# Patient Record
Sex: Female | Born: 1965
Health system: Southern US, Community
[De-identification: ages and names within clinical notes are randomized; demographics above are authoritative.]

## PROBLEM LIST (undated history)

## (undated) DIAGNOSIS — K5090 Crohn's disease, unspecified, without complications: Secondary | ICD-10-CM

## (undated) DIAGNOSIS — I10 Essential (primary) hypertension: Secondary | ICD-10-CM

## (undated) HISTORY — PX: BREAST EXCISIONAL BIOPSY: SUR124

## (undated) HISTORY — DX: Crohn's disease, unspecified, without complications: K50.90

## (undated) HISTORY — DX: Essential (primary) hypertension: I10

## (undated) HISTORY — PX: BREAST SURGERY: SHX581

---

## 1999-10-07 ENCOUNTER — Other Ambulatory Visit: Admission: RE | Admit: 1999-10-07 | Discharge: 1999-10-07 | Payer: Self-pay | Admitting: Family Medicine

## 2000-10-24 ENCOUNTER — Inpatient Hospital Stay (HOSPITAL_COMMUNITY): Admission: AD | Admit: 2000-10-24 | Discharge: 2000-10-24 | Payer: Self-pay | Admitting: Obstetrics & Gynecology

## 2002-04-17 ENCOUNTER — Other Ambulatory Visit: Admission: RE | Admit: 2002-04-17 | Discharge: 2002-04-17 | Payer: Self-pay | Admitting: Obstetrics and Gynecology

## 2004-04-03 ENCOUNTER — Other Ambulatory Visit: Admission: RE | Admit: 2004-04-03 | Discharge: 2004-04-03 | Payer: Self-pay | Admitting: Obstetrics and Gynecology

## 2005-02-19 ENCOUNTER — Ambulatory Visit (HOSPITAL_COMMUNITY): Admission: RE | Admit: 2005-02-19 | Discharge: 2005-02-19 | Payer: Self-pay | Admitting: Obstetrics and Gynecology

## 2005-03-19 ENCOUNTER — Other Ambulatory Visit: Admission: RE | Admit: 2005-03-19 | Discharge: 2005-03-19 | Payer: Self-pay | Admitting: Obstetrics and Gynecology

## 2005-05-19 ENCOUNTER — Ambulatory Visit (HOSPITAL_COMMUNITY): Admission: RE | Admit: 2005-05-19 | Discharge: 2005-05-19 | Payer: Self-pay | Admitting: Obstetrics and Gynecology

## 2005-05-25 ENCOUNTER — Ambulatory Visit (HOSPITAL_COMMUNITY): Admission: RE | Admit: 2005-05-25 | Discharge: 2005-05-25 | Payer: Self-pay | Admitting: Obstetrics and Gynecology

## 2005-06-26 ENCOUNTER — Inpatient Hospital Stay (HOSPITAL_COMMUNITY): Admission: AD | Admit: 2005-06-26 | Discharge: 2005-06-26 | Payer: Self-pay | Admitting: Obstetrics and Gynecology

## 2005-06-27 ENCOUNTER — Ambulatory Visit (HOSPITAL_COMMUNITY): Admission: RE | Admit: 2005-06-27 | Discharge: 2005-06-27 | Payer: Self-pay | Admitting: Obstetrics and Gynecology

## 2005-07-12 ENCOUNTER — Inpatient Hospital Stay (HOSPITAL_COMMUNITY): Admission: AD | Admit: 2005-07-12 | Discharge: 2005-07-15 | Payer: Self-pay | Admitting: Obstetrics and Gynecology

## 2006-03-22 ENCOUNTER — Other Ambulatory Visit: Admission: RE | Admit: 2006-03-22 | Discharge: 2006-03-22 | Payer: Self-pay | Admitting: Obstetrics and Gynecology

## 2006-05-18 ENCOUNTER — Other Ambulatory Visit: Admission: RE | Admit: 2006-05-18 | Discharge: 2006-05-18 | Payer: Self-pay | Admitting: Obstetrics and Gynecology

## 2009-05-01 ENCOUNTER — Other Ambulatory Visit: Admission: RE | Admit: 2009-05-01 | Discharge: 2009-05-01 | Payer: Self-pay | Admitting: Family Medicine
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
## 2012-03-23 ENCOUNTER — Other Ambulatory Visit (HOSPITAL_COMMUNITY)
Admission: RE | Admit: 2012-03-23 | Discharge: 2012-03-23 | Disposition: A | Discharge status: Home / Self Care | Payer: BC Managed Care – PPO | Source: Ambulatory Visit | Attending: Obstetrics and Gynecology | Admitting: Obstetrics and Gynecology

## 2012-03-23 ENCOUNTER — Other Ambulatory Visit: Payer: Self-pay | Admitting: Obstetrics and Gynecology

## 2012-03-23 DIAGNOSIS — Z1159 Encounter for screening for other viral diseases: Secondary | ICD-10-CM | POA: Insufficient documentation

## 2012-03-23 DIAGNOSIS — Z01419 Encounter for gynecological examination (general) (routine) without abnormal findings: Secondary | ICD-10-CM | POA: Insufficient documentation

## 2012-03-30 ENCOUNTER — Other Ambulatory Visit: Payer: Self-pay | Admitting: Obstetrics and Gynecology

## 2012-03-30 DIAGNOSIS — Z1231 Encounter for screening mammogram for malignant neoplasm of breast: Secondary | ICD-10-CM

## 2012-04-01 ENCOUNTER — Other Ambulatory Visit: Payer: Self-pay | Admitting: Obstetrics and Gynecology

## 2012-04-01 DIAGNOSIS — Z1231 Encounter for screening mammogram for malignant neoplasm of breast: Secondary | ICD-10-CM

## 2012-05-19 ENCOUNTER — Ambulatory Visit
Admission: RE | Admit: 2012-05-19 | Discharge: 2012-05-19 | Disposition: A | Discharge status: Home / Self Care | Payer: BC Managed Care – PPO | Source: Ambulatory Visit | Attending: Obstetrics and Gynecology | Admitting: Obstetrics and Gynecology

## 2012-05-19 DIAGNOSIS — Z1231 Encounter for screening mammogram for malignant neoplasm of breast: Secondary | ICD-10-CM

## 2016-06-15 ENCOUNTER — Other Ambulatory Visit (HOSPITAL_COMMUNITY)
Admission: RE | Admit: 2016-06-15 | Discharge: 2016-06-15 | Disposition: A | Discharge status: Home / Self Care | Payer: BC Managed Care – PPO | Source: Ambulatory Visit | Attending: Physician Assistant | Admitting: Physician Assistant

## 2016-06-15 ENCOUNTER — Other Ambulatory Visit: Payer: Self-pay | Admitting: Physician Assistant

## 2016-06-15 DIAGNOSIS — Z01419 Encounter for gynecological examination (general) (routine) without abnormal findings: Secondary | ICD-10-CM | POA: Diagnosis present

## 2016-06-15 DIAGNOSIS — Z1151 Encounter for screening for human papillomavirus (HPV): Secondary | ICD-10-CM | POA: Insufficient documentation

## 2016-06-17 LAB — CYTOLOGY - PAP

## 2016-10-09 ENCOUNTER — Other Ambulatory Visit: Payer: Self-pay | Admitting: Family Medicine

## 2016-10-09 DIAGNOSIS — Z1231 Encounter for screening mammogram for malignant neoplasm of breast: Secondary | ICD-10-CM

## 2016-11-02 ENCOUNTER — Ambulatory Visit: Payer: BC Managed Care – PPO

## 2016-11-05 ENCOUNTER — Inpatient Hospital Stay: Admission: RE | Admit: 2016-11-05 | Payer: BC Managed Care – PPO | Source: Ambulatory Visit

## 2016-11-12 ENCOUNTER — Ambulatory Visit
Admission: RE | Admit: 2016-11-12 | Discharge: 2016-11-12 | Disposition: A | Discharge status: Home / Self Care | Payer: BC Managed Care – PPO | Source: Ambulatory Visit | Attending: Family Medicine | Admitting: Family Medicine

## 2016-11-12 DIAGNOSIS — Z1231 Encounter for screening mammogram for malignant neoplasm of breast: Secondary | ICD-10-CM

## 2016-11-19 ENCOUNTER — Ambulatory Visit: Payer: BC Managed Care – PPO

## 2017-01-28 ENCOUNTER — Other Ambulatory Visit: Payer: Self-pay | Admitting: Obstetrics & Gynecology

## 2020-04-05 ENCOUNTER — Other Ambulatory Visit: Payer: Self-pay | Admitting: Family Medicine

## 2020-04-05 DIAGNOSIS — Z1231 Encounter for screening mammogram for malignant neoplasm of breast: Secondary | ICD-10-CM

## 2020-04-12 ENCOUNTER — Ambulatory Visit: Payer: BC Managed Care – PPO

## 2020-04-17 ENCOUNTER — Ambulatory Visit: Payer: BC Managed Care – PPO

## 2020-04-19 ENCOUNTER — Ambulatory Visit
Admission: RE | Admit: 2020-04-19 | Discharge: 2020-04-19 | Disposition: A | Discharge status: Home / Self Care | Payer: BC Managed Care – PPO | Source: Ambulatory Visit | Attending: Family Medicine | Admitting: Family Medicine

## 2020-04-19 ENCOUNTER — Other Ambulatory Visit: Payer: Self-pay

## 2020-04-19 ENCOUNTER — Ambulatory Visit: Payer: BC Managed Care – PPO

## 2020-04-19 DIAGNOSIS — Z1231 Encounter for screening mammogram for malignant neoplasm of breast: Secondary | ICD-10-CM

## 2020-04-25 ENCOUNTER — Ambulatory Visit: Payer: BC Managed Care – PPO

## 2020-05-08 ENCOUNTER — Telehealth: Payer: Self-pay | Admitting: Family

## 2020-05-08 NOTE — Telephone Encounter (Signed)
Advised patient of New Patient appointments that have been scheduled. Letter/Map & calendar was mailed as well

## 2020-05-23 ENCOUNTER — Other Ambulatory Visit: Payer: Self-pay | Admitting: Family

## 2020-05-23 DIAGNOSIS — D75839 Thrombocytosis, unspecified: Secondary | ICD-10-CM

## 2020-05-23 DIAGNOSIS — D508 Other iron deficiency anemias: Secondary | ICD-10-CM

## 2020-05-24 ENCOUNTER — Other Ambulatory Visit: Payer: Self-pay

## 2020-05-24 ENCOUNTER — Encounter: Payer: Self-pay | Admitting: Family

## 2020-05-24 ENCOUNTER — Inpatient Hospital Stay: Payer: BC Managed Care – PPO | Attending: Hematology & Oncology

## 2020-05-24 ENCOUNTER — Inpatient Hospital Stay (HOSPITAL_BASED_OUTPATIENT_CLINIC_OR_DEPARTMENT_OTHER): Payer: BC Managed Care – PPO | Admitting: Family

## 2020-05-24 ENCOUNTER — Telehealth: Payer: Self-pay | Admitting: *Deleted

## 2020-05-24 VITALS — BP 149/77 | HR 72 | Temp 98.4°F | Resp 18 | Ht 69.0 in | Wt 196.8 lb

## 2020-05-24 DIAGNOSIS — R42 Dizziness and giddiness: Secondary | ICD-10-CM | POA: Insufficient documentation

## 2020-05-24 DIAGNOSIS — R002 Palpitations: Secondary | ICD-10-CM

## 2020-05-24 DIAGNOSIS — D75839 Thrombocytosis, unspecified: Secondary | ICD-10-CM

## 2020-05-24 DIAGNOSIS — R5383 Other fatigue: Secondary | ICD-10-CM | POA: Diagnosis not present

## 2020-05-24 DIAGNOSIS — D508 Other iron deficiency anemias: Secondary | ICD-10-CM

## 2020-05-24 DIAGNOSIS — D6800 Von Willebrand disease, unspecified: Secondary | ICD-10-CM

## 2020-05-24 DIAGNOSIS — D473 Essential (hemorrhagic) thrombocythemia: Secondary | ICD-10-CM | POA: Insufficient documentation

## 2020-05-24 LAB — CBC WITH DIFFERENTIAL (CANCER CENTER ONLY)
Abs Immature Granulocytes: 0.07 10*3/uL (ref 0.00–0.07)
Basophils Absolute: 0.1 10*3/uL (ref 0.0–0.1)
Basophils Relative: 1 %
Eosinophils Absolute: 0.4 10*3/uL (ref 0.0–0.5)
Eosinophils Relative: 3 %
HCT: 35.7 % — ABNORMAL LOW (ref 36.0–46.0)
Hemoglobin: 12 g/dL (ref 12.0–15.0)
Immature Granulocytes: 1 %
Lymphocytes Relative: 15 %
Lymphs Abs: 2.4 10*3/uL (ref 0.7–4.0)
MCH: 29.7 pg (ref 26.0–34.0)
MCHC: 33.6 g/dL (ref 30.0–36.0)
MCV: 88.4 fL (ref 80.0–100.0)
Monocytes Absolute: 1.1 10*3/uL — ABNORMAL HIGH (ref 0.1–1.0)
Monocytes Relative: 7 %
Neutro Abs: 11.4 10*3/uL — ABNORMAL HIGH (ref 1.7–7.7)
Neutrophils Relative %: 73 %
Platelet Count: 986 10*3/uL (ref 150–400)
RBC: 4.04 MIL/uL (ref 3.87–5.11)
RDW: 15.7 % — ABNORMAL HIGH (ref 11.5–15.5)
WBC Count: 15.5 10*3/uL — ABNORMAL HIGH (ref 4.0–10.5)
nRBC: 0 % (ref 0.0–0.2)

## 2020-05-24 LAB — CMP (CANCER CENTER ONLY)
ALT: 16 U/L (ref 0–44)
AST: 18 U/L (ref 15–41)
Albumin: 4.4 g/dL (ref 3.5–5.0)
Alkaline Phosphatase: 49 U/L (ref 38–126)
Anion gap: 6 (ref 5–15)
BUN: 10 mg/dL (ref 6–20)
CO2: 32 mmol/L (ref 22–32)
Calcium: 10.2 mg/dL (ref 8.9–10.3)
Chloride: 100 mmol/L (ref 98–111)
Creatinine: 0.76 mg/dL (ref 0.44–1.00)
GFR, Est AFR Am: >60 mL/min (ref >60)
GFR, Estimated: >60 mL/min (ref >60)
Glucose, Bld: 85 mg/dL (ref 70–99)
Potassium: 3.2 mmol/L — ABNORMAL LOW (ref 3.5–5.1)
Sodium: 138 mmol/L (ref 135–145)
Total Bilirubin: 0.4 mg/dL (ref 0.3–1.2)
Total Protein: 7.4 g/dL (ref 6.5–8.1)

## 2020-05-24 LAB — SAVE SMEAR(SSMR), FOR PROVIDER SLIDE REVIEW

## 2020-05-24 NOTE — Progress Notes (Signed)
Hematology/Oncology Consultation   Name: Amy Johnson      MRN: [id number]    Location: Room/bed info not found  Date: 05/24/2020 Time:3:40 PM   REFERRING PHYSICIAN: Harlan Stains, MD  REASON FOR CONSULT: Thrombocytosis   DIAGNOSIS:  Essential thrombocythemia Acquired Von Willebrand  HISTORY OF PRESENT ILLNESS: Amy Johnson is a very pleasant 54 yo African American female with recent diagnosis of thrombocythemia.  She states that she missed last years physical due to Covid but has no prior knowledge of having an elevated platelet count.  Platelets are 986 today and WBC count 15.5. Hgb stable at 12.0, MCV 88.  She is symptomatic with fatigue, lightheadedness and occasional palpitations.  She has had some iron deficiency and takes a slow release oral iron supplement by mouth daily. She states that her recent cycles have been irregular and heavy with cramping and clots. She has history of uterine fibroids and is followed by gynecology.  She has noted occasional hot flashes.  She has had no other episodes of blood loss. No abnormal bruising, no petechiae.  She is scheduled to have her first colonoscopy on 06/28/2020.  She denies sickle cell disease or trait.  She has one son and no history of miscarriage.  Her father had lung cancer, maternal grandfather - esophageal and maternal uncle - leukemia.  She denies fever, chills, n/v, cough, rash, SOB, chest pain, abdominal pain or changes in bowel or bladder habits.  She takes Miralax as needed for constipation.  She has occasional numbness and tingling in her heft leg and pain and tingling in her heft foot. She states that this started after wearing pointy toed shoes.  No swelling in her extremities at this time. She notes occasional puffiness in her ankles when she sits for an extended period of time.  No falls or syncopal episodes to report.  She has maintained a good appetite but admits that she could hydrate better throughout the day.  No  smoking, ETOH or recreational drug use.  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   ROS: All other 10 point review of systems is negative.   PAST MEDICAL HISTORY:   No past medical history on file.  ALLERGIES: No Known Allergies    MEDICATIONS:  Current Outpatient Medications on File Prior to Visit  Medication Sig Dispense Refill  . EDARBYCLOR 40-25 MG TABS Take 1 tablet by mouth daily.     No current facility-administered medications on file prior to visit.     PAST SURGICAL HISTORY Past Surgical History:  Procedure Laterality Date  . BREAST EXCISIONAL BIOPSY Left     FAMILY HISTORY: No family history on file.  SOCIAL HISTORY:  has no history on file for tobacco use, alcohol use, and drug use.  PERFORMANCE STATUS: The patient's performance status is 1 - Symptomatic but completely ambulatory  PHYSICAL EXAM: Most Recent Vital Signs: There were no vitals taken for this visit. BP (!) 149/77 (BP Location: Right Arm, Patient Position: Sitting)   Pulse 72   Temp 98.4 F (36.9 C) (Oral)   Resp 18   Ht '5\' 9"'  (1.753 m)   Wt 196 lb 12.8 oz (89.3 kg)   SpO2 100%   BMI 29.06 kg/m   General Appearance:    Alert, cooperative, no distress, appears stated age  Head:    Normocephalic, without obvious abnormality, atraumatic  Eyes:    PERRL, conjunctiva/corneas clear, EOM's intact,  fundi    benign, both eyes        Throat:   Lips, mucosa, and tongue normal; teeth and gums normal  Neck:   Supple, symmetrical, trachea midline, no adenopathy;    thyroid:  no enlargement/tenderness/nodules; no carotid   bruit or JVD  Back:     Symmetric, no curvature, ROM normal, no CVA tenderness  Lungs:     Clear to auscultation bilaterally, respirations unlabored  Chest Wall:    No tenderness or deformity   Heart:    Regular rate and rhythm, S1 and S2 normal, no murmur, rub   or gallop     Abdomen:     Soft, non-tender,  bowel sounds active all four quadrants,    no masses, no organomegaly        Extremities:   Extremities normal, atraumatic, no cyanosis or edema  Pulses:   2+ and symmetric all extremities  Skin:   Skin color, texture, turgor normal, no rashes or lesions  Lymph nodes:   Cervical, supraclavicular, and axillary nodes normal  Neurologic:   CNII-XII intact, normal strength, sensation and reflexes    throughout    LABORATORY DATA:  Results for orders placed or performed in visit on 05/24/20 (from the past 48 hour(s))  CBC with Differential (Cancer Center Only)     Status: Abnormal   Collection Time: 05/24/20  2:48 PM  Result Value Ref Range   WBC Count 15.5 (H) 4.0 - 10.5 K/uL   RBC 4.04 3.87 - 5.11 MIL/uL   Hemoglobin 12.0 12.0 - 15.0 g/dL   HCT 35.7 (L) 36 - 46 %   MCV 88.4 80.0 - 100.0 fL   MCH 29.7 26.0 - 34.0 pg   MCHC 33.6 30.0 - 36.0 g/dL   RDW 15.7 (H) 11.5 - 15.5 %   Platelet Count 986 (HH) 150 - 400 K/uL    Comment: This critical result has verified and been called to Amy Johnson by Amy Johnson on 08 06 2021 at 1508, and has been read back. RTV   nRBC 0.0 0.0 - 0.2 %   Neutrophils Relative % 73 %   Neutro Abs 11.4 (H) 1.7 - 7.7 K/uL   Lymphocytes Relative 15 %   Lymphs Abs 2.4 0.7 - 4.0 K/uL   Monocytes Relative 7 %   Monocytes Absolute 1.1 (H) 0 - 1 K/uL   Eosinophils Relative 3 %   Eosinophils Absolute 0.4 0 - 0 K/uL   Basophils Relative 1 %   Basophils Absolute 0.1 0 - 0 K/uL   Immature Granulocytes 1 %   Abs Immature Granulocytes 0.07 0.00 - 0.07 K/uL    Comment: Performed at [facility] at [facility], [address] [place], [place] [address]  CMP ([facility] only)     Status: Abnormal   Collection Time: 05/24/20  2:48 PM  Result Value Ref Range   Sodium 138 135 - 145 mmol/L   Potassium 3.2 (L) 3.5 - 5.1 mmol/L   Chloride 100 98 - 111 mmol/L   CO2 32 22 - 32 mmol/L   Glucose, Bld 85 70 - 99 mg/dL    Comment: Glucose  reference range applies only to samples taken after fasting for at least 8 hours.   BUN 10 6 - 20 mg/dL   Creatinine 0.76 0.44 - 1.00 mg/dL   Calcium 10.2 8.9 - 10.3 mg/dL   Total Protein 7.4 6.5 - 8.1 g/dL   Albumin  4.4 3.5 - 5.0 g/dL   AST 18 15 - 41 U/L   ALT 16 0 - 44 U/L   Alkaline Phosphatase 49 38 - 126 U/L   Total Bilirubin 0.4 0.3 - 1.2 mg/dL   GFR, Est Non Af Am >60 >60 mL/min   GFR, Est AFR Am >60 >60 mL/min   Anion gap 6 5 - 15    Comment: Performed at [facility] at [facility], [address], [place], [place] [address]  [facility])     Status: None   Collection Time: 05/24/20  2:48 PM  Result Value Ref Range   Smear Review SMEAR STAINED AND AVAILABLE FOR REVIEW     Comment: Performed at [facility] at [facility], [address] [place], [place] [address]      RADIOGRAPHY: No results found.     PATHOLOGY: None  ASSESSMENT/PLAN: Ms. Splinter is a very pleasant 54 yo African American female with recent diagnosis or an elevated platelet count as well as an elevated WBC count.  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  She also has acquired VWD so she will need to avoid aspirin and aspirin and NSAIDS. She will try taking tylenol instead of ibuprofen for menstrual cramps. She will also notify us prior to having any surgical procedures.  We will get her onto Hydrea 500 mg PO BID and plan to see her back in another month for follow-up and repeat lab work. We discussed potential side effects in detail. I also reached out to [facility] to let her know we would be sending in a prescription.   All questions at this time were answered and she is in agreement with the plan. She can contact our office with any questions or concerns. We can certainly see her sooner if needed.   She was discussed with and also seen by Dr. Marin Olp and he is in agreement with  the aforementioned.   Laverna Peace, NP    ADDENDUM: I saw Ms. Kem with Jud[facility] Roch.  Unfortunately, I have a feeling that we are probably looking at an actual myeloproliferative disorder.  I looked at her blood smear under the microscope.  She had no nucleated red blood cells.  There were really no hypochromic or microcytic red blood cells.  She had a lot of platelets.  She had several large platelets.  There were a couple hypersegmented polys.  With the JAK2 panel I think is going to be important.  Unfortunately, I do not think we have iron studies on her even though I think that her iron should be okay given the fact that she is not anemic and her MCV is okay.  She still has her spleen in it.  She does not have sickle cell so there is no splenic "auto infarction."  I would hold off on x-rays right now.  If we do find she does have a myeloproliferative disorder, then we would probably get an ultrasound of the abdomen looking for splenomegaly.  We are sending off the von Willebrand panel.  If she does have essential thrombocythemia, she would be at high risk for an acquired von Willebrand disease.  As such, we told her not to take any aspirin right now.  Ultimately, she may need to have a bone marrow biopsy done if her tests are not conclusive.  She is very nice.  She is a  schoolteacher.  We really want to make sure that she is able to teach and not miss classes.  Spent about 45 minutes with her today.  I know that she was quite inundated with a lot of information.  She asked a lot of good questions.  She is certainly concerned over the possibility of a primary bone marrow disorder.  Once we get our labs back, particular the JAK2 panel, then we will get her back to the office.  Lattie Haw, MD

## 2020-05-24 NOTE — Telephone Encounter (Signed)
Platelets reported 986 by Richardson Landry in lab.  Dr Marin Olp notified  Patient seen in office today

## 2020-05-27 LAB — IRON AND TIBC
Iron: 55 ug/dL (ref 41–142)
Saturation Ratios: 18 % — ABNORMAL LOW (ref 21–57)
TIBC: 306 ug/dL (ref 236–444)
UIBC: 251 ug/dL (ref 120–384)

## 2020-05-27 LAB — LACTATE DEHYDROGENASE: LDH: 220 U/L — ABNORMAL HIGH (ref 98–192)

## 2020-05-27 LAB — FERRITIN: Ferritin: 26 ng/mL (ref 11–307)

## 2020-05-28 ENCOUNTER — Inpatient Hospital Stay: Payer: BC Managed Care – PPO

## 2020-05-28 ENCOUNTER — Other Ambulatory Visit: Payer: Self-pay

## 2020-05-28 DIAGNOSIS — D473 Essential (hemorrhagic) thrombocythemia: Secondary | ICD-10-CM

## 2020-05-28 DIAGNOSIS — D6800 Von Willebrand disease, unspecified: Secondary | ICD-10-CM

## 2020-05-28 LAB — APTT: aPTT: 39 seconds — ABNORMAL HIGH (ref 24–36)

## 2020-05-29 LAB — VON WILLEBRAND PANEL
Coagulation Factor VIII: 93 % (ref 56–140)
Ristocetin Co-factor, Plasma: 25 % — ABNORMAL LOW (ref 50–200)
Von Willebrand Antigen, Plasma: 74 % (ref 50–200)

## 2020-05-29 LAB — COAG STUDIES INTERP REPORT

## 2020-06-10 LAB — JAK 2 V617F (GENPATH)

## 2020-06-13 ENCOUNTER — Other Ambulatory Visit: Payer: Self-pay | Admitting: Hematology & Oncology

## 2020-06-13 MED ORDER — HYDROXYUREA 500 MG PO CAPS: 500.0000 mg | ORAL_CAPSULE | Freq: Two times a day (BID) | ORAL | 3 refills | Status: DC

## 2020-06-14 ENCOUNTER — Other Ambulatory Visit: Payer: Self-pay | Admitting: Family

## 2020-06-14 DIAGNOSIS — D473 Essential (hemorrhagic) thrombocythemia: Secondary | ICD-10-CM

## 2020-06-14 MED ORDER — HYDROXYUREA 500 MG PO CAPS: 500.0000 mg | ORAL_CAPSULE | Freq: Two times a day (BID) | ORAL | 3 refills | Status: DC

## 2020-06-21 ENCOUNTER — Telehealth: Payer: Self-pay | Admitting: *Deleted

## 2020-06-21 NOTE — Telephone Encounter (Signed)
Received a call from patient stating that she had not received a call from her specialty pharmacy regarding her Hydrea and she has already received it in the mail.  [facility] who stated that her Rx is on hold there and had not been shipped yet.  Called patient who said that Rx came from [facility].  Told patient to call [facility] and inform them that you have not received proper counseling regarding medication.  She agreed to do so.  She then expressed a lot of anxiety about her new diagnosis and had many questions that I could not answer.  I suggested an appt with Laverna Peace or Dr Marin Olp to answer all of these questions.  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  I told her I would give a message to Laverna Peace to call her regarding her questions when we return from holiday weekend. Patient appreciative of call.

## 2020-06-25 ENCOUNTER — Other Ambulatory Visit: Payer: Self-pay | Admitting: Family

## 2020-06-25 ENCOUNTER — Telehealth: Payer: Self-pay | Admitting: Family

## 2020-06-25 DIAGNOSIS — D473 Essential (hemorrhagic) thrombocythemia: Secondary | ICD-10-CM

## 2020-06-25 MED ORDER — HYDROXYUREA 500 MG PO CAPS: 500.0000 mg | ORAL_CAPSULE | Freq: Two times a day (BID) | ORAL | 3 refills | Status: DC

## 2020-06-25 NOTE — Telephone Encounter (Signed)
I returned call to patient.  I left a message advising her that is she had any questions about her specific drug she would ned to call [facility] to discuss or should could wait for her next visit on 9/17 w/ Sarah C to discuss futher. Per 9/7 secure chat request

## 2020-06-26 ENCOUNTER — Telehealth: Payer: Self-pay | Admitting: Family

## 2020-06-26 ENCOUNTER — Other Ambulatory Visit: Payer: Self-pay | Admitting: Family

## 2020-06-26 NOTE — Telephone Encounter (Signed)
I tried calling patient to advise that Appointments for 9/17 had been rescheduled out 3 weeks per  9/8 sch msg.her voicemail was full so I have mailed an updated calendar & letter to her as well.

## 2020-06-26 NOTE — Telephone Encounter (Signed)
I spoke again with Amy Johnson via phone about her medication. She is concerned about side effects. We discussed these in detail and I sent her a link to sign up for MyChart. We will send her some more education materials and she will also contact [facility] for education. If she has any other questions our pharmacist is happy to discuss with her.  She is also concerned about taking the Hydrea BID so she will start out at 1 tablet once a day. She plans to start the Hydrea after her colonoscopy. We will see her again in a month (we will move her 9/17 appointment out another 3 weeks).  She will also be having a colonoscopy later this week. GI will contact our office if clearance is needed. Dr. Marin Olp was able to review her VWB panel and no intervention is needed prior to her procedure.  No other questions at this time.

## 2020-07-05 ENCOUNTER — Other Ambulatory Visit: Payer: BC Managed Care – PPO

## 2020-07-05 ENCOUNTER — Ambulatory Visit: Payer: BC Managed Care – PPO | Admitting: Family

## 2020-07-25 ENCOUNTER — Inpatient Hospital Stay: Payer: BC Managed Care – PPO

## 2020-07-25 ENCOUNTER — Inpatient Hospital Stay: Payer: BC Managed Care – PPO | Admitting: Family

## 2020-07-26 ENCOUNTER — Telehealth: Payer: Self-pay | Admitting: *Deleted

## 2020-07-26 NOTE — Telephone Encounter (Signed)
Call received from patient requesting that Sarah or Dr. Marin Olp review her recent CT enterography and call her back to discuss findings.  Informed pt that CT would not be reviewed until next week and to expect phone call then.  Patient appreciative of assistance and has no further questions at this time.

## 2020-08-01 ENCOUNTER — Ambulatory Visit: Payer: BC Managed Care – PPO | Admitting: Family

## 2020-08-01 ENCOUNTER — Other Ambulatory Visit: Payer: BC Managed Care – PPO

## 2020-08-05 ENCOUNTER — Other Ambulatory Visit: Payer: Self-pay | Admitting: Family Medicine

## 2020-08-23 ENCOUNTER — Telehealth: Payer: Self-pay | Admitting: *Deleted

## 2020-08-23 ENCOUNTER — Other Ambulatory Visit: Payer: Self-pay

## 2020-08-23 ENCOUNTER — Inpatient Hospital Stay (HOSPITAL_BASED_OUTPATIENT_CLINIC_OR_DEPARTMENT_OTHER): Payer: BC Managed Care – PPO | Admitting: Family

## 2020-08-23 ENCOUNTER — Inpatient Hospital Stay: Payer: BC Managed Care – PPO | Attending: Hematology & Oncology

## 2020-08-23 ENCOUNTER — Encounter: Payer: Self-pay | Admitting: Family

## 2020-08-23 VITALS — BP 124/67 | HR 73 | Resp 19 | Wt 200.4 lb

## 2020-08-23 DIAGNOSIS — D473 Essential (hemorrhagic) thrombocythemia: Secondary | ICD-10-CM

## 2020-08-23 DIAGNOSIS — D500 Iron deficiency anemia secondary to blood loss (chronic): Secondary | ICD-10-CM

## 2020-08-23 DIAGNOSIS — D6800 Von Willebrand disease, unspecified: Secondary | ICD-10-CM

## 2020-08-23 DIAGNOSIS — D508 Other iron deficiency anemias: Secondary | ICD-10-CM

## 2020-08-23 DIAGNOSIS — D509 Iron deficiency anemia, unspecified: Secondary | ICD-10-CM | POA: Diagnosis not present

## 2020-08-23 LAB — CMP (CANCER CENTER ONLY)
ALT: 12 U/L (ref 0–44)
AST: 14 U/L — ABNORMAL LOW (ref 15–41)
Albumin: 4.4 g/dL (ref 3.5–5.0)
Alkaline Phosphatase: 50 U/L (ref 38–126)
Anion gap: 5 (ref 5–15)
BUN: 12 mg/dL (ref 6–20)
CO2: 34 mmol/L — ABNORMAL HIGH (ref 22–32)
Calcium: 10.6 mg/dL — ABNORMAL HIGH (ref 8.9–10.3)
Chloride: 100 mmol/L (ref 98–111)
Creatinine: 0.7 mg/dL (ref 0.44–1.00)
GFR, Estimated: >60 mL/min (ref >60)
Glucose, Bld: 93 mg/dL (ref 70–99)
Potassium: 3.3 mmol/L — ABNORMAL LOW (ref 3.5–5.1)
Sodium: 139 mmol/L (ref 135–145)
Total Bilirubin: 0.3 mg/dL (ref 0.3–1.2)
Total Protein: 7.7 g/dL (ref 6.5–8.1)

## 2020-08-23 LAB — CBC WITH DIFFERENTIAL (CANCER CENTER ONLY)
Abs Immature Granulocytes: 0.04 10*3/uL (ref 0.00–0.07)
Basophils Absolute: 0.1 10*3/uL (ref 0.0–0.1)
Basophils Relative: 1 %
Eosinophils Absolute: 0.4 10*3/uL (ref 0.0–0.5)
Eosinophils Relative: 3 %
HCT: 37.2 % (ref 36.0–46.0)
Hemoglobin: 12 g/dL (ref 12.0–15.0)
Immature Granulocytes: 0 %
Lymphocytes Relative: 20 %
Lymphs Abs: 2.4 10*3/uL (ref 0.7–4.0)
MCH: 29.9 pg (ref 26.0–34.0)
MCHC: 32.3 g/dL (ref 30.0–36.0)
MCV: 92.5 fL (ref 80.0–100.0)
Monocytes Absolute: 1 10*3/uL (ref 0.1–1.0)
Monocytes Relative: 8 %
Neutro Abs: 8.2 10*3/uL — ABNORMAL HIGH (ref 1.7–7.7)
Neutrophils Relative %: 68 %
Platelet Count: 902 10*3/uL (ref 150–400)
RBC: 4.02 MIL/uL (ref 3.87–5.11)
RDW: 14.6 % (ref 11.5–15.5)
WBC Count: 12 10*3/uL — ABNORMAL HIGH (ref 4.0–10.5)
nRBC: 0 % (ref 0.0–0.2)

## 2020-08-23 LAB — APTT: aPTT: 40 seconds — ABNORMAL HIGH (ref 24–36)

## 2020-08-23 LAB — LACTATE DEHYDROGENASE: LDH: 188 U/L (ref 98–192)

## 2020-08-23 NOTE — Telephone Encounter (Signed)
Jory Ee NP notified of platelet count-902.  No new orders received at this time.

## 2020-08-24 NOTE — Progress Notes (Signed)
Hematology and Oncology Follow Up Visit  Amy Johnson [id number] 1965/12/06 54 y.o. 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 She started taking Hydrea 500 mg PO daily at bedtime about 3 weeks ago.  She notes that the pill makes her fingers tingling despite wearing gloves as well as her mouth and throat. No oral sores or lesions. WBC count is 12.0, platelets 902 and Hgb 12.0. PTT 40.  She was recently diagnosed with Crohn's disease. She has an appointment next week to discuss treatment options and states they mentioned Remicade and Humira as potential options.  No obvious blood loss noted. No bruising or petechiae.  She prefers to take an oral supplement for the iron deficiency and asked what supplement would be beneficial to her bone marrow. Folic acid was suggested and she plans to give this a try.  No fever, chills, n/v, cough, rash, dizziness, SOB, chest pain, palpitations, abdominal pain or changes in bowel or bladder habits.  No swelling, numbness or tingling in her extremities at this time.  She states that she was also diagnosed with ankylosing spondylosis.  Appetite is good and she is doing her best to stay well hydrated. Her weight is stable.   ECOG Performance Status: 1 - Symptomatic but completely ambulatory  Medications:  Allergies as of 08/23/2020   No Known Allergies     Medication List       Accurate as of August 23, 2020 11:59 PM. If you have any questions, ask your nurse or doctor.        STOP taking these medications   ibuprofen 800 MG tablet Commonly known as: ADVIL Stopped by: Laverna Peace, NP     TAKE these medications   COVID-19 Specimen Collection Kit See admin instructions.   Edarbyclor 40-25 MG Tabs Generic  drug: Azilsartan-Chlorthalidone Take 1 tablet by mouth daily.   Ferrous Sulfate Dried 143 (45 Fe) MG Tbcr Take by mouth.   FLINTSTONES W/IRON PO Take 1 tablet by mouth daily.   hydroxyurea 500 MG capsule Commonly known as: HYDREA Take 1 capsule (500 mg total) by mouth 2 (two) times daily. May take with food to minimize GI side effects.   metoprolol tartrate 50 MG tablet Commonly known as: LOPRESSOR Take 50 mg by mouth 2 (two) times daily.   Potassium Chloride ER 20 MEQ Tbcr Take 1 tablet by mouth 2 (two) times daily.   potassium chloride SA 20 MEQ tablet Commonly known as: KLOR-CON Take by mouth.   SLOW RELEASE IRON PO Take 1 capsule by mouth daily.   Vitamin D 50 MCG (2000 UT) tablet Take 2,000 Units by mouth daily.       Allergies: No Known Allergies  Past Medical History, Surgical history, Social history, and Family History were reviewed and updated.  Review of Systems: All other 10 point review of systems is negative.   Physical Exam:  weight is 200 lb 6.4 oz (90.9 kg). Her blood pressure is 124/67 and her pulse is 73. Her respiration is 19.   Wt Readings from Last 3 Encounters:  08/23/20 200 lb 6.4 oz (90.9 kg)  05/24/20 196 lb 12.8 oz (89.3 kg)    Ocular: Sclerae unicteric, pupils equal, round and reactive to light Ear-nose-throat: Oropharynx clear, dentition fair Lymphatic:  No cervical or supraclavicular adenopathy Lungs no rales or rhonchi, good excursion bilaterally Heart regular rate and rhythm, no murmur appreciated Abd soft, nontender, positive bowel sounds MSK no focal spinal tenderness, no joint edema Neuro: non-focal, well-oriented, appropriate affect Breasts: Deferred   Lab Results  Component Value Date   WBC 12.0 (H) 08/23/2020   HGB 12.0 08/23/2020   HCT 37.2 08/23/2020   MCV 92.5 08/23/2020   PLT 902 (HH) 08/23/2020   Lab Results  Component Value Date   FERRITIN 26 05/24/2020   IRON 55 05/24/2020   TIBC 306 05/24/2020   UIBC  251 05/24/2020   IRONPCTSAT 18 (L) 05/24/2020   Lab Results  Component Value Date   RBC 4.02 08/23/2020   No results found for: KPAFRELGTCHN, LAMBDASER, KAPLAMBRATIO No results found for: IGGSERUM, IGA, IGMSERUM No results found for: Odetta Pink, SPEI   Chemistry      Component Value Date/Time   NA 139 08/23/2020 1506   K 3.3 (L) 08/23/2020 1506   CL 100 08/23/2020 1506   CO2 34 (H) 08/23/2020 1506   BUN 12 08/23/2020 1506   CREATININE 0.70 08/23/2020 1506      Component Value Date/Time   CALCIUM 10.6 (H) 08/23/2020 1506   ALKPHOS 50 08/23/2020 1506   AST 14 (L) 08/23/2020 1506   ALT 12 08/23/2020 1506   BILITOT 0.3 08/23/2020 1506       Impression and Plan: Amy Johnson is a pleasant 54 yo African American with essential thrombocythemia, JAK2 positive, acquired VWB disease and iron deficiency secondary to intermittent GI blood loss with Crohn's.   We discussed her medication, lab work and aforementioned diagnoses in great detail.  We will continue to follow along closely with her monitoring symptoms and blood work.  Follow-up in 6 weeks.  She can contact our office with any questions or concerns.  Greater than 50% of her 1 hour visit was spent counseling and coordinating care.    Laverna Peace, NP 11/6/20219:06 AM

## 2020-08-26 LAB — IRON AND TIBC
Iron: 46 ug/dL (ref 41–142)
Saturation Ratios: 14 % — ABNORMAL LOW (ref 21–57)
TIBC: 320 ug/dL (ref 236–444)
UIBC: 274 ug/dL (ref 120–384)

## 2020-08-26 LAB — FERRITIN: Ferritin: 17 ng/mL (ref 11–307)

## 2020-08-27 ENCOUNTER — Telehealth: Payer: Self-pay | Admitting: *Deleted

## 2020-08-27 NOTE — Telephone Encounter (Signed)
Called pt regarding labs, pt advised she has not been taking her oral iron daily and will begin taking it everyday  starting tomorrow.

## 2020-08-27 NOTE — Telephone Encounter (Signed)
-----   Message from Eliezer Bottom, NP sent at 08/26/2020  3:08 PM EST ----- Iron is still low. Is she willing to try IV iron or does she want to continue the oral supplement? ----- Message ----- From: Interface, Lab In [place] Sent: 08/23/2020   3:17 PM EST To: Eliezer Bottom, NP

## 2020-10-03 ENCOUNTER — Inpatient Hospital Stay: Payer: BC Managed Care – PPO | Attending: Hematology & Oncology

## 2020-10-03 ENCOUNTER — Other Ambulatory Visit: Payer: Self-pay

## 2020-10-03 ENCOUNTER — Encounter: Payer: Self-pay | Admitting: Hematology & Oncology

## 2020-10-03 ENCOUNTER — Inpatient Hospital Stay (HOSPITAL_BASED_OUTPATIENT_CLINIC_OR_DEPARTMENT_OTHER): Payer: BC Managed Care – PPO | Admitting: Hematology & Oncology

## 2020-10-03 VITALS — BP 152/83 | HR 73 | Temp 97.5°F | Resp 18 | Wt 194.0 lb

## 2020-10-03 DIAGNOSIS — D473 Essential (hemorrhagic) thrombocythemia: Secondary | ICD-10-CM | POA: Diagnosis not present

## 2020-10-03 DIAGNOSIS — D500 Iron deficiency anemia secondary to blood loss (chronic): Secondary | ICD-10-CM

## 2020-10-03 DIAGNOSIS — K5090 Crohn's disease, unspecified, without complications: Secondary | ICD-10-CM | POA: Insufficient documentation

## 2020-10-03 DIAGNOSIS — D6800 Von Willebrand disease, unspecified: Secondary | ICD-10-CM

## 2020-10-03 DIAGNOSIS — D509 Iron deficiency anemia, unspecified: Secondary | ICD-10-CM | POA: Diagnosis not present

## 2020-10-03 LAB — CMP (CANCER CENTER ONLY)
ALT: 16 U/L (ref 0–44)
AST: 15 U/L (ref 15–41)
Albumin: 4.1 g/dL (ref 3.5–5.0)
Alkaline Phosphatase: 59 U/L (ref 38–126)
Anion gap: 12 (ref 5–15)
BUN: 12 mg/dL (ref 6–20)
CO2: 31 mmol/L (ref 22–32)
Calcium: 9.9 mg/dL (ref 8.9–10.3)
Chloride: 97 mmol/L — ABNORMAL LOW (ref 98–111)
Creatinine: 0.76 mg/dL (ref 0.44–1.00)
GFR, Estimated: >60 mL/min (ref >60)
Glucose, Bld: 110 mg/dL — ABNORMAL HIGH (ref 70–99)
Potassium: 3.5 mmol/L (ref 3.5–5.1)
Sodium: 140 mmol/L (ref 135–145)
Total Bilirubin: 0.5 mg/dL (ref 0.3–1.2)
Total Protein: 8 g/dL (ref 6.5–8.1)

## 2020-10-03 LAB — CBC WITH DIFFERENTIAL (CANCER CENTER ONLY)
Abs Immature Granulocytes: 0.06 10*3/uL (ref 0.00–0.07)
Basophils Absolute: 0.1 10*3/uL (ref 0.0–0.1)
Basophils Relative: 1 %
Eosinophils Absolute: 0.3 10*3/uL (ref 0.0–0.5)
Eosinophils Relative: 2 %
HCT: 37.3 % (ref 36.0–46.0)
Hemoglobin: 12.1 g/dL (ref 12.0–15.0)
Immature Granulocytes: 1 %
Lymphocytes Relative: 19 %
Lymphs Abs: 2.3 10*3/uL (ref 0.7–4.0)
MCH: 30.7 pg (ref 26.0–34.0)
MCHC: 32.4 g/dL (ref 30.0–36.0)
MCV: 94.7 fL (ref 80.0–100.0)
Monocytes Absolute: 1.2 10*3/uL — ABNORMAL HIGH (ref 0.1–1.0)
Monocytes Relative: 9 %
Neutro Abs: 8.5 10*3/uL — ABNORMAL HIGH (ref 1.7–7.7)
Neutrophils Relative %: 68 %
Platelet Count: 744 10*3/uL — ABNORMAL HIGH (ref 150–400)
RBC: 3.94 MIL/uL (ref 3.87–5.11)
RDW: 16.6 % — ABNORMAL HIGH (ref 11.5–15.5)
WBC Count: 12.5 10*3/uL — ABNORMAL HIGH (ref 4.0–10.5)
nRBC: 0 % (ref 0.0–0.2)

## 2020-10-03 LAB — APTT: aPTT: 39 seconds — ABNORMAL HIGH (ref 24–36)

## 2020-10-03 NOTE — Progress Notes (Signed)
Hematology and Oncology Follow Up Visit  Amy Johnson [id number] 03/31/1966 54 y.o. 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 Pazos is here today for follow-up.  Overall, things seem to be doing okay although she is now dealing with Crohn's disease.  She sees [facility] in [place].  I will have to talk to the doctors there.  There are are afraid of putting her on Remicade because she is on Hydrea.  She is on a very low-dose of Hydrea that will not be immunosuppressive.  It is hard to say how bad the Crohn's disease is.  She is responding to the Hydrea.  She is on a 500 mg a day.  Her platelet count is gradually coming down which is nice to see.  I told her that because of the Crohn's disease and likely iron deficiency, her platelet count will be on the higher side.  She said that she has a sore throat.  This happened after he ate an apple recently.  She has a cough.  The cough is not that productive.  There is no hemoptysis.  She thinks that this might be getting a little bit better.  She is on a steroid for her Crohn's disease.  I think the steroid is probably budesonide.  She has had no diarrhea.  There is no leg swelling.  She has had no rashes.  She says that on occasion she has some burning with her skin.  She thinks it might be from the [facility].  Currently, I would have to say that her performance status is ECOG 1.   Medications:  Allergies as of 10/03/2020      Reactions   Amlodipine Swelling   Irbesartan-hydrochlorothiazide Other (See Comments)   Swelling    Valsartan-hydrochlorothiazide Other (See Comments)   Drowsy   Other Other (See Comments)   Olmesartan Medoxomil-hctz Other (See Comments)   Other reaction(s): Cough      Medication List        Accurate as of October 03, 2020  3:59 PM. If you have any questions, ask your nurse or doctor.        STOP taking these medications   potassium chloride SA 20 MEQ tablet Commonly known as: KLOR-CON Stopped by: [name], MD     TAKE these medications   acetaminophen 650 MG CR tablet Commonly known as: TYLENOL Take by mouth.   budesonide 3 MG 24 hr capsule Commonly known as: ENTOCORT EC Take 9 mg by mouth daily.   COVID-19 Specimen Collection Kit See admin instructions.   Edarbyclor 40-25 MG Tabs Generic drug: Azilsartan-Chlorthalidone Take 1 tablet by mouth daily.   Ferrous Sulfate Dried 143 (45 Fe) MG Tbcr Take by mouth.   FLINTSTONES W/IRON PO Take 1 tablet by mouth daily.   hydroxyurea 500 MG capsule Commonly known as: HYDREA Take 1 capsule (500 mg total) by mouth 2 (two) times daily. May take with food to minimize GI side effects.   metoprolol tartrate 50 MG tablet Commonly known as: LOPRESSOR Take 50 mg by mouth 2 (two) times daily.   Potassium Chloride ER 20 MEQ Tbcr Take 1 tablet by mouth 2 (two) times daily.   SLOW RELEASE IRON PO Take 1 capsule by mouth daily.   Vitamin D 50 MCG (  2000 UT) tablet Take 2,000 Units by mouth daily.       Allergies:  Allergies  Allergen Reactions  . Amlodipine Swelling  . Irbesartan-Hydrochlorothiazide Other (See Comments)    Swelling    . Valsartan-Hydrochlorothiazide Other (See Comments)    Drowsy   . Other Other (See Comments)  . Olmesartan Medoxomil-Hctz Other (See Comments)    Other reaction(s): Cough    Past Medical History, Surgical history, Social history, and Family History were reviewed and updated.  Review of Systems: Review of Systems  Constitutional: Negative.   HENT: Positive for sore throat.   Eyes: Negative.   Respiratory: Positive for cough.   Cardiovascular: Negative.   Gastrointestinal: Positive for abdominal pain.  Genitourinary: Negative.   Musculoskeletal: Negative.    Skin: Negative.   Neurological: Negative.   Endo/Heme/Allergies: Negative.   Psychiatric/Behavioral: Negative.      Physical Exam:  weight is 194 lb (88 kg). Her oral temperature is 97.5 F (36.4 C) (abnormal). Her blood pressure is 152/83 (abnormal) and her pulse is 73. Her respiration is 18 and oxygen saturation is 99%.   Wt Readings from Last 3 Encounters:  10/03/20 194 lb (88 kg)  08/23/20 200 lb 6.4 oz (90.9 kg)  05/24/20 196 lb 12.8 oz (89.3 kg)    Physical Exam Vitals reviewed.  HENT:     Head: Normocephalic and atraumatic.     Mouth/Throat:     Mouth: Oropharynx is clear and moist.  Eyes:     Extraocular Movements: EOM normal.     Pupils: Pupils are equal, round, and reactive to light.  Cardiovascular:     Rate and Rhythm: Normal rate and regular rhythm.     Heart sounds: Normal heart sounds.  Pulmonary:     Effort: Pulmonary effort is normal.     Breath sounds: Normal breath sounds.  Abdominal:     General: Bowel sounds are normal.     Palpations: Abdomen is soft.  Musculoskeletal:        General: No tenderness, deformity or edema. Normal range of motion.     Cervical back: Normal range of motion.  Lymphadenopathy:     Cervical: No cervical adenopathy.  Skin:    General: Skin is warm and dry.     Findings: No erythema or rash.  Neurological:     Mental Status: She is alert and oriented to person, place, and time.  Psychiatric:        Mood and Affect: Mood and affect normal.        Behavior: Behavior normal.        Thought Content: Thought content normal.        Judgment: Judgment normal.      Lab Results  Component Value Date   WBC 12.5 (H) 10/03/2020   HGB 12.1 10/03/2020   HCT 37.3 10/03/2020   MCV 94.7 10/03/2020   PLT 744 (H) 10/03/2020   Lab Results  Component Value Date   FERRITIN 17 08/23/2020   IRON 46 08/23/2020   TIBC 320 08/23/2020   UIBC 274 08/23/2020   IRONPCTSAT 14 (L) 08/23/2020   Lab Results  Component Value Date    RBC 3.94 10/03/2020   No results found for: KPAFRELGTCHN, LAMBDASER, KAPLAMBRATIO No results found for: IGGSERUM, IGA, IGMSERUM No results found for: Ronnald Ramp, A1GS, A2GS, BETS, BETA2SER, GAMS, MSPIKE, SPEI   Chemistry      Component Value Date/Time   NA 140 10/03/2020 1503   K 3.5 10/03/2020 1503  CL 97 (L) 10/03/2020 1503   CO2 31 10/03/2020 1503   BUN 12 10/03/2020 1503   CREATININE 0.76 10/03/2020 1503      Component Value Date/Time   CALCIUM 9.9 10/03/2020 1503   ALKPHOS 59 10/03/2020 1503   AST 15 10/03/2020 1503   ALT 16 10/03/2020 1503   BILITOT 0.5 10/03/2020 1503       Impression and Plan: Ms. Durfey is a pleasant 54 yo African American with essential thrombocythemia, JAK2 positive, acquired VWB disease and iron deficiency secondary to intermittent GI blood loss with Crohn's.    Of note, today is her birthday.  Very excited for her.  I really hope that she will have a nice birthday.  I would not make any changes with the Hydrea.  I looked at her blood smear.  She does have elevated platelet counts.  Her platelets are very well granulated.  She has several large platelets.  I would like to think that her platelet count will continue to respond.  I will have to speak with her gastroenterologist.  Hopefully, we will be able to get her on Remicade.  I really would like to see her back in 6 weeks.  By then, we should hopefully see her platelet count improving nicely.  We will be checking her iron studies.  I am just happy that she is going to have a nice birthday.   [name], MD 12/16/20213:59 PM

## 2020-10-04 ENCOUNTER — Telehealth: Payer: Self-pay

## 2020-10-04 LAB — IRON AND TIBC
Iron: 29 ug/dL — ABNORMAL LOW (ref 41–142)
Saturation Ratios: 9 % — ABNORMAL LOW (ref 21–57)
TIBC: 310 ug/dL (ref 236–444)
UIBC: 281 ug/dL (ref 120–384)

## 2020-10-04 LAB — FERRITIN: Ferritin: 30 ng/mL (ref 11–307)

## 2020-10-04 LAB — LACTATE DEHYDROGENASE: LDH: 144 U/L (ref 98–192)

## 2020-10-04 NOTE — Telephone Encounter (Signed)
Called pt for return appt per 10/03/20 los and her vm is full, a schedule was printed and mailed to her...Marland KitchenMarland KitchenMarland Kitchen AOM

## 2020-10-08 ENCOUNTER — Telehealth: Payer: Self-pay | Admitting: *Deleted

## 2020-10-08 NOTE — Telephone Encounter (Signed)
Unable to reach pt via phone. Mailbox is full.

## 2020-10-08 NOTE — Telephone Encounter (Signed)
-----   Message from [name], MD sent at 10/07/2020  5:01 PM EST ----- Call - the iron is very low!! This means that the iron you are taking by mouth is not working.  You need IV iron in order to get the iron level back up and the platelets down!!!  Please set up appt for iron if she is agreeable!!  [name]

## 2020-10-14 ENCOUNTER — Telehealth: Payer: Self-pay | Admitting: Hematology & Oncology

## 2020-10-14 NOTE — Telephone Encounter (Signed)
Called patient ans was unable to [facility] due to mailbox being full.  Calendar was mailed with appointments that have bene scheduled per 12/21 sch msg

## 2020-10-23 ENCOUNTER — Other Ambulatory Visit: Payer: Self-pay

## 2020-10-23 ENCOUNTER — Other Ambulatory Visit: Payer: Self-pay | Admitting: Family

## 2020-10-23 ENCOUNTER — Inpatient Hospital Stay: Payer: BC Managed Care – PPO | Attending: Hematology & Oncology

## 2020-10-23 DIAGNOSIS — D473 Essential (hemorrhagic) thrombocythemia: Secondary | ICD-10-CM | POA: Insufficient documentation

## 2020-10-23 DIAGNOSIS — D509 Iron deficiency anemia, unspecified: Secondary | ICD-10-CM | POA: Insufficient documentation

## 2020-10-23 DIAGNOSIS — K5090 Crohn's disease, unspecified, without complications: Secondary | ICD-10-CM | POA: Insufficient documentation

## 2020-10-23 DIAGNOSIS — D680 Von Willebrand's disease: Secondary | ICD-10-CM | POA: Insufficient documentation

## 2020-10-23 NOTE — Progress Notes (Signed)
Pt discharged in no apparent distress. Pt left ambulatory without assistance. Pt aware of discharge instructions and verbalized understanding and had no further questions.  

## 2020-10-23 NOTE — Progress Notes (Signed)
Pt. did not get treated today because there  is no insurance on file. Pt. Was asked if she's willing to pay out of pocket and she denied. She will look for her current insurance card and contact us when she'll find it.

## 2020-10-29 ENCOUNTER — Other Ambulatory Visit: Payer: Self-pay

## 2020-10-29 ENCOUNTER — Inpatient Hospital Stay: Payer: BC Managed Care – PPO

## 2020-10-29 VITALS — BP 119/45 | HR 75 | Resp 18

## 2020-10-29 DIAGNOSIS — D509 Iron deficiency anemia, unspecified: Secondary | ICD-10-CM | POA: Diagnosis not present

## 2020-10-29 DIAGNOSIS — D473 Essential (hemorrhagic) thrombocythemia: Secondary | ICD-10-CM | POA: Diagnosis not present

## 2020-10-29 DIAGNOSIS — D680 Von Willebrand's disease: Secondary | ICD-10-CM | POA: Diagnosis not present

## 2020-10-29 DIAGNOSIS — K5090 Crohn's disease, unspecified, without complications: Secondary | ICD-10-CM | POA: Diagnosis not present

## 2020-10-29 MED ORDER — SODIUM CHLORIDE 0.9 % IV SOLN
200.0000 mg | Freq: Once | INTRAVENOUS | Status: AC
  Administered 2020-10-29: 200 mg via INTRAVENOUS
  Filled 2020-10-29: qty 200

## 2020-10-29 MED ORDER — SODIUM CHLORIDE 0.9 % IV SOLN
Freq: Once | INTRAVENOUS | Status: AC
  Filled 2020-10-29: qty 250

## 2020-10-29 NOTE — Patient Instructions (Signed)

## 2020-11-01 ENCOUNTER — Inpatient Hospital Stay: Payer: BC Managed Care – PPO

## 2020-11-01 ENCOUNTER — Other Ambulatory Visit: Payer: Self-pay

## 2020-11-01 VITALS — BP 122/70 | HR 78 | Temp 99.0°F | Resp 20

## 2020-11-01 DIAGNOSIS — D509 Iron deficiency anemia, unspecified: Secondary | ICD-10-CM

## 2020-11-01 DIAGNOSIS — D473 Essential (hemorrhagic) thrombocythemia: Secondary | ICD-10-CM | POA: Diagnosis not present

## 2020-11-01 MED ORDER — SODIUM CHLORIDE 0.9 % IV SOLN
200.0000 mg | Freq: Once | INTRAVENOUS | Status: AC
  Administered 2020-11-01: 200 mg via INTRAVENOUS
  Filled 2020-11-01: qty 200

## 2020-11-01 MED ORDER — SODIUM CHLORIDE 0.9 % IV SOLN
Freq: Once | INTRAVENOUS | Status: AC
  Filled 2020-11-01: qty 250

## 2020-11-01 NOTE — Patient Instructions (Signed)

## 2020-11-06 ENCOUNTER — Telehealth: Payer: Self-pay

## 2020-11-06 NOTE — Telephone Encounter (Signed)
Pt called and left a vm to call regarding her 1/20 appt, she did not advise which phone line and a vm was left on  Her cell.  Advised pt to call the office back in the morning   Matheus Spiker

## 2020-11-07 ENCOUNTER — Telehealth: Payer: Self-pay

## 2020-11-07 ENCOUNTER — Encounter: Payer: Self-pay | Admitting: Hematology & Oncology

## 2020-11-07 ENCOUNTER — Inpatient Hospital Stay: Payer: BC Managed Care – PPO

## 2020-11-07 ENCOUNTER — Inpatient Hospital Stay (HOSPITAL_BASED_OUTPATIENT_CLINIC_OR_DEPARTMENT_OTHER): Payer: BC Managed Care – PPO | Admitting: Hematology & Oncology

## 2020-11-07 ENCOUNTER — Other Ambulatory Visit: Payer: Self-pay

## 2020-11-07 VITALS — BP 148/67 | HR 78 | Temp 98.7°F | Resp 16 | Wt 199.0 lb

## 2020-11-07 DIAGNOSIS — D473 Essential (hemorrhagic) thrombocythemia: Secondary | ICD-10-CM

## 2020-11-07 DIAGNOSIS — D75839 Thrombocytosis, unspecified: Secondary | ICD-10-CM

## 2020-11-07 DIAGNOSIS — K5000 Crohn's disease of small intestine without complications: Secondary | ICD-10-CM | POA: Insufficient documentation

## 2020-11-07 DIAGNOSIS — D500 Iron deficiency anemia secondary to blood loss (chronic): Secondary | ICD-10-CM | POA: Diagnosis not present

## 2020-11-07 DIAGNOSIS — N920 Excessive and frequent menstruation with regular cycle: Secondary | ICD-10-CM | POA: Insufficient documentation

## 2020-11-07 DIAGNOSIS — I10 Essential (primary) hypertension: Secondary | ICD-10-CM | POA: Insufficient documentation

## 2020-11-07 DIAGNOSIS — N946 Dysmenorrhea, unspecified: Secondary | ICD-10-CM | POA: Insufficient documentation

## 2020-11-07 HISTORY — DX: Essential (hemorrhagic) thrombocythemia: D47.3

## 2020-11-07 LAB — CBC WITH DIFFERENTIAL (CANCER CENTER ONLY)
Abs Immature Granulocytes: 0.04 10*3/uL (ref 0.00–0.07)
Basophils Absolute: 0.1 10*3/uL (ref 0.0–0.1)
Basophils Relative: 1 %
Eosinophils Absolute: 0.2 10*3/uL (ref 0.0–0.5)
Eosinophils Relative: 2 %
HCT: 42 % (ref 36.0–46.0)
Hemoglobin: 13.8 g/dL (ref 12.0–15.0)
Immature Granulocytes: 0 %
Lymphocytes Relative: 18 %
Lymphs Abs: 2.4 10*3/uL (ref 0.7–4.0)
MCH: 31.6 pg (ref 26.0–34.0)
MCHC: 32.9 g/dL (ref 30.0–36.0)
MCV: 96.1 fL (ref 80.0–100.0)
Monocytes Absolute: 1.1 10*3/uL — ABNORMAL HIGH (ref 0.1–1.0)
Monocytes Relative: 8 %
Neutro Abs: 9.4 10*3/uL — ABNORMAL HIGH (ref 1.7–7.7)
Neutrophils Relative %: 71 %
Platelet Count: 811 10*3/uL — ABNORMAL HIGH (ref 150–400)
RBC: 4.37 MIL/uL (ref 3.87–5.11)
RDW: 16.4 % — ABNORMAL HIGH (ref 11.5–15.5)
WBC Count: 13.2 10*3/uL — ABNORMAL HIGH (ref 4.0–10.5)
nRBC: 0 % (ref 0.0–0.2)

## 2020-11-07 LAB — CMP (CANCER CENTER ONLY)
ALT: 14 U/L (ref 0–44)
AST: 15 U/L (ref 15–41)
Albumin: 4.5 g/dL (ref 3.5–5.0)
Alkaline Phosphatase: 56 U/L (ref 38–126)
Anion gap: 5 (ref 5–15)
BUN: 13 mg/dL (ref 6–20)
CO2: 34 mmol/L — ABNORMAL HIGH (ref 22–32)
Calcium: 10.7 mg/dL — ABNORMAL HIGH (ref 8.9–10.3)
Chloride: 100 mmol/L (ref 98–111)
Creatinine: 0.79 mg/dL (ref 0.44–1.00)
GFR, Estimated: >60 mL/min (ref >60)
Glucose, Bld: 123 mg/dL — ABNORMAL HIGH (ref 70–99)
Potassium: 3.6 mmol/L (ref 3.5–5.1)
Sodium: 139 mmol/L (ref 135–145)
Total Bilirubin: 0.3 mg/dL (ref 0.3–1.2)
Total Protein: 8 g/dL (ref 6.5–8.1)

## 2020-11-07 LAB — LACTATE DEHYDROGENASE: LDH: 138 U/L (ref 98–192)

## 2020-11-07 LAB — SAVE SMEAR(SSMR), FOR PROVIDER SLIDE REVIEW

## 2020-11-07 NOTE — Progress Notes (Signed)
Hematology and Oncology Follow Up Visit  Amy Johnson [id number] 1965/10/22 55 y.o. 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  She made it through the holiday season.  She is doing pretty well.  Unfortunately, she cannot see her Gastroenterologist on Monday because of the snowstorm.  She is doing okay.  She did get iron supplementation.  We gave her IV iron because her iron saturation was only 9%.  She had no problems with the IV iron.  She is on the Hydrea at 500 mg a day.  Her platelet count is up a little bit today.  I am not going to make a change with the Hydrea dose as of yet.  She has had no problems with fever.  There has been no rashes.  She has had no bleeding.  She still has her monthly cycle although it may not be as heavy.  She has had no visual changes.  There has been no nausea or vomiting.  I think the Gastroenterologist still is thinking about doing Remicade.  I told her that from my point of view, I really do not have any problems with Remicade.  I do not see how that would adversely affect the essential thrombocythemia.    She is on a steroid for her Crohn's disease.  I think the steroid is probably budesonide.  This certainly could be causing the white cell count to be up a little bit.  She has had no diarrhea.  Currently, I would have to say that her performance status is ECOG 1.   Medications:  Allergies as of 11/07/2020      Reactions   Amlodipine Swelling   Irbesartan-hydrochlorothiazide Other (See Comments)   Swelling    Valsartan-hydrochlorothiazide Other (See Comments)   Drowsy   Other Other (See Comments)   Olmesartan Medoxomil-hctz Other (See Comments)   Other reaction(s): Cough      Medication List        Accurate as of November 07, 2020  4:05 PM. If you have any questions, ask your nurse or doctor.        acetaminophen 650 MG CR tablet Commonly known as: TYLENOL Take by mouth.   budesonide 3 MG 24 hr capsule Commonly known as: ENTOCORT EC Take 9 mg by mouth daily.   COVID-19 Specimen Collection Kit See admin instructions.   Edarbyclor 40-25 MG Tabs Generic drug: Azilsartan-Chlorthalidone Take 1 tablet by mouth daily.   Ferrous Sulfate Dried 143 (45 Fe) MG Tbcr Take by mouth.   FLINTSTONES W/IRON PO Take 1 tablet by mouth daily.   hydroxyurea 500 MG capsule Commonly known as: HYDREA Take 1 capsule (500 mg total) by mouth 2 (two) times daily. May take with food to minimize GI side effects.   metoprolol tartrate 50 MG tablet Commonly known as: LOPRESSOR Take 50 mg by mouth 2 (two) times daily.   Potassium Chloride ER 20 MEQ Tbcr Take 1 tablet by mouth 2 (two) times daily.   SLOW RELEASE IRON PO Take 1 capsule by mouth daily.   Vitamin D 50 MCG (2000 UT) tablet Take 2,000 Units by mouth daily.       Allergies:  Allergies  Allergen Reactions  . Amlodipine Swelling  . Irbesartan-Hydrochlorothiazide Other (See Comments)  Swelling    . Valsartan-Hydrochlorothiazide Other (See Comments)    Drowsy   . Other Other (See Comments)  . Olmesartan Medoxomil-Hctz Other (See Comments)    Other reaction(s): Cough    Past Medical History, Surgical history, Social history, and Family History were reviewed and updated.  Review of Systems: Review of Systems  Constitutional: Negative.   HENT: Positive for sore throat.   Eyes: Negative.   Respiratory: Positive for cough.   Cardiovascular: Negative.   Gastrointestinal: Positive for abdominal pain.  Genitourinary: Negative.   Musculoskeletal: Negative.   Skin: Negative.   Neurological: Negative.   Endo/Heme/Allergies: Negative.   Psychiatric/Behavioral: Negative.      Physical Exam:  weight is 199 lb  (90.3 kg). Her oral temperature is 98.7 F (37.1 C). Her blood pressure is 148/67 (abnormal) and her pulse is 78. Her respiration is 16 and oxygen saturation is 100%.   Wt Readings from Last 3 Encounters:  11/07/20 199 lb (90.3 kg)  10/03/20 194 lb (88 kg)  08/23/20 200 lb 6.4 oz (90.9 kg)    Physical Exam Vitals reviewed.  HENT:     Head: Normocephalic and atraumatic.  Eyes:     Pupils: Pupils are equal, round, and reactive to light.  Cardiovascular:     Rate and Rhythm: Normal rate and regular rhythm.     Heart sounds: Normal heart sounds.  Pulmonary:     Effort: Pulmonary effort is normal.     Breath sounds: Normal breath sounds.  Abdominal:     General: Bowel sounds are normal.     Palpations: Abdomen is soft.  Musculoskeletal:        General: No tenderness or deformity. Normal range of motion.     Cervical back: Normal range of motion.  Lymphadenopathy:     Cervical: No cervical adenopathy.  Skin:    General: Skin is warm and dry.     Findings: No erythema or rash.  Neurological:     Mental Status: She is alert and oriented to person, place, and time.  Psychiatric:        Behavior: Behavior normal.        Thought Content: Thought content normal.        Judgment: Judgment normal.      Lab Results  Component Value Date   WBC 13.2 (H) 11/07/2020   HGB 13.8 11/07/2020   HCT 42.0 11/07/2020   MCV 96.1 11/07/2020   PLT 811 (H) 11/07/2020   Lab Results  Component Value Date   FERRITIN 30 10/03/2020   IRON 29 (L) 10/03/2020   TIBC 310 10/03/2020   UIBC 281 10/03/2020   IRONPCTSAT 9 (L) 10/03/2020   Lab Results  Component Value Date   RBC 4.37 11/07/2020   No results found for: KPAFRELGTCHN, LAMBDASER, KAPLAMBRATIO No results found for: IGGSERUM, IGA, IGMSERUM No results found for: Odetta Pink, SPEI   Chemistry      Component Value Date/Time   NA 139 11/07/2020 1506   K 3.6 11/07/2020 1506   CL  100 11/07/2020 1506   CO2 34 (H) 11/07/2020 1506   BUN 13 11/07/2020 1506   CREATININE 0.79 11/07/2020 1506      Component Value Date/Time   CALCIUM 10.7 (H) 11/07/2020 1506   ALKPHOS 56 11/07/2020 1506   AST 15 11/07/2020 1506   ALT 14 11/07/2020 1506   BILITOT 0.3 11/07/2020 1506       Impression and Plan: Amy Johnson  is a pleasant 55 yo African American with essential thrombocythemia, JAK2 positive, acquired VWB disease and iron deficiency secondary to intermittent GI blood loss with Crohn's.    Again, I am just willing to sit tight with respect to the thrombocythemia.  Hopefully, we will see that the platelet count will start coming down.  If not, we will have to increase the Hydrea dose.  I think Ms. Abelson is somewhat sensitive to medications.  I really want to try to avoid having to make a lot of changes since she has things going on.  Again, if the Gastroenterologist wants to try Remicade I do not have a problem with this.  I think we have to keep get her back on a frequent basis.  I will plan to see her back in another month.     [name], MD 1/20/20224:05 PM

## 2020-11-07 NOTE — Telephone Encounter (Signed)
Called and left with f/u appt per 1-20 los    Amy Johnson

## 2020-11-08 LAB — IRON AND TIBC
Iron: 75 ug/dL (ref 41–142)
Saturation Ratios: 26 % (ref 21–57)
TIBC: 285 ug/dL (ref 236–444)
UIBC: 210 ug/dL (ref 120–384)

## 2020-11-08 LAB — FERRITIN: Ferritin: 171 ng/mL (ref 11–307)

## 2020-11-11 ENCOUNTER — Telehealth: Payer: Self-pay | Admitting: *Deleted

## 2020-11-11 NOTE — Telephone Encounter (Signed)
-----   Message from [name], MD sent at 11/08/2020  3:51 PM EST ----- Call - the iron level is much better!!!  Amy Johnson

## 2020-11-11 NOTE — Telephone Encounter (Signed)
Call placed to patient to notify her per order of Dr. Marin Olp that "the iron level is much better!!! Pete"  Pt would like to know if she should continue taking oral iron at this time.  Pt notified per order of Dr. Marin Olp to stop taking oral iron at this time.  Pt appreciative of call and has no further questions at this time.

## 2020-11-26 ENCOUNTER — Telehealth: Payer: Self-pay

## 2020-11-26 NOTE — Telephone Encounter (Signed)
Pt called in to confirm her 12/18/20 appts and to move them back to [employer]

## 2020-12-04 ENCOUNTER — Telehealth: Payer: Self-pay

## 2020-12-04 NOTE — Telephone Encounter (Signed)
Pt called and lm to verify appts, called pt back at designated number and her vm is full     Amy Johnson

## 2020-12-13 ENCOUNTER — Ambulatory Visit: Payer: BC Managed Care – PPO | Admitting: Hematology & Oncology

## 2020-12-13 ENCOUNTER — Other Ambulatory Visit: Payer: BC Managed Care – PPO

## 2020-12-18 ENCOUNTER — Other Ambulatory Visit: Payer: BC Managed Care – PPO

## 2020-12-18 ENCOUNTER — Ambulatory Visit: Payer: BC Managed Care – PPO | Admitting: Hematology & Oncology

## 2020-12-18 ENCOUNTER — Other Ambulatory Visit: Payer: Self-pay

## 2020-12-18 ENCOUNTER — Inpatient Hospital Stay: Payer: BC Managed Care – PPO | Attending: Hematology & Oncology

## 2020-12-18 ENCOUNTER — Inpatient Hospital Stay: Payer: BC Managed Care – PPO | Admitting: Family

## 2020-12-18 ENCOUNTER — Encounter: Payer: Self-pay | Admitting: Family

## 2020-12-18 VITALS — BP 140/62 | HR 71 | Temp 98.2°F | Resp 18 | Ht 68.9 in | Wt 200.1 lb

## 2020-12-18 DIAGNOSIS — D473 Essential (hemorrhagic) thrombocythemia: Secondary | ICD-10-CM | POA: Diagnosis not present

## 2020-12-18 DIAGNOSIS — K5090 Crohn's disease, unspecified, without complications: Secondary | ICD-10-CM | POA: Insufficient documentation

## 2020-12-18 DIAGNOSIS — D75839 Thrombocytosis, unspecified: Secondary | ICD-10-CM

## 2020-12-18 DIAGNOSIS — D6800 Von Willebrand disease, unspecified: Secondary | ICD-10-CM

## 2020-12-18 DIAGNOSIS — D500 Iron deficiency anemia secondary to blood loss (chronic): Secondary | ICD-10-CM | POA: Diagnosis not present

## 2020-12-18 DIAGNOSIS — D509 Iron deficiency anemia, unspecified: Secondary | ICD-10-CM | POA: Diagnosis not present

## 2020-12-18 LAB — CBC WITH DIFFERENTIAL (CANCER CENTER ONLY)
Abs Immature Granulocytes: 0.13 10*3/uL — ABNORMAL HIGH (ref 0.00–0.07)
Basophils Absolute: 0.1 10*3/uL (ref 0.0–0.1)
Basophils Relative: 1 %
Eosinophils Absolute: 0.2 10*3/uL (ref 0.0–0.5)
Eosinophils Relative: 2 %
HCT: 33.8 % — ABNORMAL LOW (ref 36.0–46.0)
Hemoglobin: 11.4 g/dL — ABNORMAL LOW (ref 12.0–15.0)
Immature Granulocytes: 1 %
Lymphocytes Relative: 21 %
Lymphs Abs: 2.6 10*3/uL (ref 0.7–4.0)
MCH: 33.3 pg (ref 26.0–34.0)
MCHC: 33.7 g/dL (ref 30.0–36.0)
MCV: 98.8 fL (ref 80.0–100.0)
Monocytes Absolute: 0.9 10*3/uL (ref 0.1–1.0)
Monocytes Relative: 8 %
Neutro Abs: 8.4 10*3/uL — ABNORMAL HIGH (ref 1.7–7.7)
Neutrophils Relative %: 67 %
Platelet Count: 716 10*3/uL — ABNORMAL HIGH (ref 150–400)
RBC: 3.42 MIL/uL — ABNORMAL LOW (ref 3.87–5.11)
RDW: 15 % (ref 11.5–15.5)
WBC Count: 12.3 10*3/uL — ABNORMAL HIGH (ref 4.0–10.5)
nRBC: 0 % (ref 0.0–0.2)

## 2020-12-18 LAB — CMP (CANCER CENTER ONLY)
ALT: 10 U/L (ref 0–44)
AST: 12 U/L — ABNORMAL LOW (ref 15–41)
Albumin: 4.1 g/dL (ref 3.5–5.0)
Alkaline Phosphatase: 53 U/L (ref 38–126)
Anion gap: 7 (ref 5–15)
BUN: 11 mg/dL (ref 6–20)
CO2: 31 mmol/L (ref 22–32)
Calcium: 10.2 mg/dL (ref 8.9–10.3)
Chloride: 101 mmol/L (ref 98–111)
Creatinine: 0.77 mg/dL (ref 0.44–1.00)
GFR, Estimated: >60 mL/min (ref >60)
Glucose, Bld: 115 mg/dL — ABNORMAL HIGH (ref 70–99)
Potassium: 3 mmol/L — ABNORMAL LOW (ref 3.5–5.1)
Sodium: 139 mmol/L (ref 135–145)
Total Bilirubin: 0.2 mg/dL — ABNORMAL LOW (ref 0.3–1.2)
Total Protein: 6.8 g/dL (ref 6.5–8.1)

## 2020-12-18 LAB — LACTATE DEHYDROGENASE: LDH: 115 U/L (ref 98–192)

## 2020-12-18 LAB — SAVE SMEAR(SSMR), FOR PROVIDER SLIDE REVIEW

## 2020-12-18 LAB — RETICULOCYTES
Immature Retic Fract: 16.3 % — ABNORMAL HIGH (ref 2.3–15.9)
RBC.: 3.43 MIL/uL — ABNORMAL LOW (ref 3.87–5.11)
Retic Count, Absolute: 94.7 10*3/uL (ref 19.0–186.0)
Retic Ct Pct: 2.8 % (ref 0.4–3.1)

## 2020-12-18 MED ORDER — HYDROXYUREA 500 MG PO CAPS: 500.0000 mg | ORAL_CAPSULE | Freq: Every day | ORAL | 3 refills | Status: DC

## 2020-12-18 NOTE — Progress Notes (Signed)
Hematology and Oncology Follow Up Visit  Amy Johnson [id number] 07-24-1966 55 y.o. 12/18/2020   Principle Diagnosis:  Essential thrombocythemia - JAK2 positive Acquired Von Willebrand Crohn's Disease Iron deficiency anemia   Current Therapy:        Hydrea 500 mg PO daily IV iron as indicated Folic acid - OTC   Interim History:  Amy Johnson is here today for follow-up. She is symptomatic with fatigue and states that she is currently on her cycle. She notes aches and abdominal pain. He cycle is heavy the first 2-3 days.  No other blood loss noted. She denies any abnormal bruising, no petechiae.  No fever, chills, n/v, cough, rash, dizziness, SOB, chest pain, palpitations or changes in bowel or bladder habits.  She states that she is still on Entocort for Crohn's disease and continues to consider starting Remicade. From our standpoint she is ok to start when she is ready. Hgb is down slightly today at 11.4, MCV 98, WBC count 12.3 and platelets 716. Her counts continue to slowly improve on Hydrea. She has tolerated the 500 mg PO daily nicely.  No swelling, tenderness, numbness or tingling noted in her extremities at this time.  No falls or syncope to report.  She has maintained a good appetite but admits that she could better hydrate throughout the day. Her weight is stable at 200 lbs.   ECOG Performance Status: 1 - Symptomatic but completely ambulatory  Medications:  Allergies as of 12/18/2020      Reactions   Amlodipine Swelling   Irbesartan-hydrochlorothiazide Other (See Comments)   Swelling    Valsartan-hydrochlorothiazide Other (See Comments)   Drowsy   Other Other (See Comments)   Olmesartan Medoxomil-hctz Other (See Comments)   Other reaction(s): Cough      Medication List       Accurate as of December 18, 2020 10:18 PM. If you have any questions, ask your nurse or doctor.        STOP taking these medications   COVID-19 Specimen Collection Kit Stopped by: Laverna Peace, NP     TAKE these medications   acetaminophen 650 MG CR tablet Commonly known as: TYLENOL Take by mouth.   budesonide 3 MG 24 hr capsule Commonly known as: ENTOCORT EC Take 9 mg by mouth daily.   Edarbyclor 40-25 MG Tabs Generic drug: Azilsartan-Chlorthalidone Take 1 tablet by mouth daily.   FLINTSTONES W/IRON PO Take 1 tablet by mouth daily.   hydroxyurea 500 MG capsule Commonly known as: HYDREA Take 1 capsule (500 mg total) by mouth 2 (two) times daily. May take with food to minimize GI side effects.   metoprolol tartrate 50 MG tablet Commonly known as: LOPRESSOR Take 50 mg by mouth 2 (two) times daily.   Potassium Chloride ER 20 MEQ Tbcr Take 1 tablet by mouth 2 (two) times daily.   Vitamin D 50 MCG (2000 UT) tablet Take 2,000 Units by mouth daily.       Allergies:  Allergies  Allergen Reactions  . Amlodipine Swelling  . Irbesartan-Hydrochlorothiazide Other (See Comments)    Swelling    . Valsartan-Hydrochlorothiazide Other (See Comments)    Drowsy   . Other Other (See Comments)  . Olmesartan Medoxomil-Hctz Other (See Comments)    Other reaction(s): Cough    Past Medical History, Surgical history, Social history, and Family History were reviewed and updated.  Review of Systems: All other 10 point review of systems is negative.   Physical Exam:  height is 5'  8.9" (1.75 m) and weight is 200 lb 1.9 oz (90.8 kg). Her oral temperature is 98.2 F (36.8 C). Her blood pressure is 140/62 and her pulse is 71. Her respiration is 18 and oxygen saturation is 100%.   Wt Readings from Last 3 Encounters:  12/18/20 200 lb 1.9 oz (90.8 kg)  11/07/20 199 lb (90.3 kg)  10/03/20 194 lb (88 kg)    Ocular: Sclerae unicteric, pupils equal, round and reactive to light Ear-nose-throat: Oropharynx clear, dentition fair Lymphatic: No cervical or supraclavicular adenopathy Lungs no rales or rhonchi, good excursion bilaterally Heart regular rate and rhythm, no  murmur appreciated Abd soft, nontender, positive bowel sounds MSK no focal spinal tenderness, no joint edema Neuro: non-focal, well-oriented, appropriate affect Breasts: Deferred   Lab Results  Component Value Date   WBC 12.3 (H) 12/18/2020   HGB 11.4 (L) 12/18/2020   HCT 33.8 (L) 12/18/2020   MCV 98.8 12/18/2020   PLT 716 (H) 12/18/2020   Lab Results  Component Value Date   FERRITIN 171 11/07/2020   IRON 75 11/07/2020   TIBC 285 11/07/2020   UIBC 210 11/07/2020   IRONPCTSAT 26 11/07/2020   Lab Results  Component Value Date   RETICCTPCT 2.8 12/18/2020   RBC 3.42 (L) 12/18/2020   RBC 3.43 (L) 12/18/2020   No results found for: KPAFRELGTCHN, LAMBDASER, KAPLAMBRATIO No results found for: IGGSERUM, IGA, IGMSERUM No results found for: Odetta Pink, SPEI   Chemistry      Component Value Date/Time   NA 139 12/18/2020 1524   K 3.0 (L) 12/18/2020 1524   CL 101 12/18/2020 1524   CO2 31 12/18/2020 1524   BUN 11 12/18/2020 1524   CREATININE 0.77 12/18/2020 1524      Component Value Date/Time   CALCIUM 10.2 12/18/2020 1524   ALKPHOS 53 12/18/2020 1524   AST 12 (L) 12/18/2020 1524   ALT 10 12/18/2020 1524   BILITOT 0.2 (L) 12/18/2020 1524       Impression and Plan: Ms. [name] is a pleasant 55 yo African American with essential thrombocythemia, JAK2 positive, acquired VWB disease and iron deficiency secondary to intermittent GI blood loss with Crohn's.   She is tolerating Hydrea nicely and her counts continue to slowly improve on her current dose of 500 mg PO daily. She will continue this same regimen.  Ion studies are pending. If possible she would like to do oral iron replacement if needed.  Follow-up in another 8 weeks.   Laverna Peace, NP 3/2/202210:18 PM

## 2020-12-19 ENCOUNTER — Telehealth: Payer: Self-pay

## 2020-12-19 LAB — IRON AND TIBC
Iron: 36 ug/dL — ABNORMAL LOW (ref 41–142)
Saturation Ratios: 13 % — ABNORMAL LOW (ref 21–57)
TIBC: 268 ug/dL (ref 236–444)
UIBC: 232 ug/dL (ref 120–384)

## 2020-12-19 LAB — FERRITIN: Ferritin: 36 ng/mL (ref 11–307)

## 2020-12-19 MED FILL — HYDROXYUREA 500 MG CAPSULE: 500 | 90 days supply | Qty: 90 | Fill #0

## 2020-12-19 NOTE — Telephone Encounter (Signed)
appts made amd a vm was left with appts per 12/18/20 los  Amy Johnson

## 2020-12-27 ENCOUNTER — Telehealth: Payer: Self-pay | Admitting: *Deleted

## 2020-12-27 MED ORDER — FUSION PLUS PO CAPS: 1.0000 | ORAL_CAPSULE | Freq: Every day | ORAL | 3 refills | Status: DC

## 2020-12-27 NOTE — Telephone Encounter (Signed)
Fusion plus rx sent to pt pharmacy. Pt requesting to take oral iron instead of IV infusions. Reviewed with NP.

## 2020-12-27 NOTE — Telephone Encounter (Signed)
Returned call to pt, unable to reach.Lmovm.

## 2021-02-12 ENCOUNTER — Other Ambulatory Visit: Payer: BC Managed Care – PPO

## 2021-02-12 ENCOUNTER — Inpatient Hospital Stay: Payer: BC Managed Care – PPO | Attending: Hematology & Oncology | Admitting: Family

## 2021-02-12 ENCOUNTER — Inpatient Hospital Stay: Payer: BC Managed Care – PPO

## 2021-02-12 ENCOUNTER — Other Ambulatory Visit: Payer: Self-pay

## 2021-02-12 ENCOUNTER — Ambulatory Visit: Payer: BC Managed Care – PPO | Admitting: Family

## 2021-02-12 VITALS — BP 138/53 | HR 75 | Temp 98.0°F | Resp 17 | Wt 204.1 lb

## 2021-02-12 DIAGNOSIS — D6800 Von Willebrand disease, unspecified: Secondary | ICD-10-CM

## 2021-02-12 DIAGNOSIS — K5090 Crohn's disease, unspecified, without complications: Secondary | ICD-10-CM | POA: Insufficient documentation

## 2021-02-12 DIAGNOSIS — D500 Iron deficiency anemia secondary to blood loss (chronic): Secondary | ICD-10-CM | POA: Diagnosis not present

## 2021-02-12 DIAGNOSIS — D509 Iron deficiency anemia, unspecified: Secondary | ICD-10-CM | POA: Diagnosis not present

## 2021-02-12 DIAGNOSIS — D473 Essential (hemorrhagic) thrombocythemia: Secondary | ICD-10-CM | POA: Diagnosis present

## 2021-02-12 LAB — CBC WITH DIFFERENTIAL (CANCER CENTER ONLY)
Abs Immature Granulocytes: 0.04 10*3/uL (ref 0.00–0.07)
Basophils Absolute: 0.1 10*3/uL (ref 0.0–0.1)
Basophils Relative: 1 %
Eosinophils Absolute: 0.2 10*3/uL (ref 0.0–0.5)
Eosinophils Relative: 1 %
HCT: 38.5 % (ref 36.0–46.0)
Hemoglobin: 13 g/dL (ref 12.0–15.0)
Immature Granulocytes: 0 %
Lymphocytes Relative: 22 %
Lymphs Abs: 2.6 10*3/uL (ref 0.7–4.0)
MCH: 33.6 pg (ref 26.0–34.0)
MCHC: 33.8 g/dL (ref 30.0–36.0)
MCV: 99.5 fL (ref 80.0–100.0)
Monocytes Absolute: 0.7 10*3/uL (ref 0.1–1.0)
Monocytes Relative: 6 %
Neutro Abs: 8 10*3/uL — ABNORMAL HIGH (ref 1.7–7.7)
Neutrophils Relative %: 70 %
Platelet Count: 705 10*3/uL — ABNORMAL HIGH (ref 150–400)
RBC: 3.87 MIL/uL (ref 3.87–5.11)
RDW: 13.7 % (ref 11.5–15.5)
WBC Count: 11.5 10*3/uL — ABNORMAL HIGH (ref 4.0–10.5)
nRBC: 0 % (ref 0.0–0.2)

## 2021-02-12 LAB — CMP (CANCER CENTER ONLY)
ALT: 15 U/L (ref 0–44)
AST: 15 U/L (ref 15–41)
Albumin: 4.4 g/dL (ref 3.5–5.0)
Alkaline Phosphatase: 48 U/L (ref 38–126)
Anion gap: 5 (ref 5–15)
BUN: 10 mg/dL (ref 6–20)
CO2: 34 mmol/L — ABNORMAL HIGH (ref 22–32)
Calcium: 10.6 mg/dL — ABNORMAL HIGH (ref 8.9–10.3)
Chloride: 99 mmol/L (ref 98–111)
Creatinine: 0.74 mg/dL (ref 0.44–1.00)
GFR, Estimated: >60 mL/min (ref >60)
Glucose, Bld: 131 mg/dL — ABNORMAL HIGH (ref 70–99)
Potassium: 3.4 mmol/L — ABNORMAL LOW (ref 3.5–5.1)
Sodium: 138 mmol/L (ref 135–145)
Total Bilirubin: 0.5 mg/dL (ref 0.3–1.2)
Total Protein: 7.5 g/dL (ref 6.5–8.1)

## 2021-02-12 LAB — RETICULOCYTES
Immature Retic Fract: 17.4 % — ABNORMAL HIGH (ref 2.3–15.9)
RBC.: 3.92 MIL/uL (ref 3.87–5.11)
Retic Count, Absolute: 105.4 10*3/uL (ref 19.0–186.0)
Retic Ct Pct: 2.7 % (ref 0.4–3.1)

## 2021-02-12 LAB — LACTATE DEHYDROGENASE: LDH: 143 U/L (ref 98–192)

## 2021-02-12 LAB — SAVE SMEAR(SSMR), FOR PROVIDER SLIDE REVIEW

## 2021-02-12 NOTE — Progress Notes (Signed)
Hematology and Oncology Follow Up Visit  Amy Johnson [id number] 17-Apr-1966 55 y.o. 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 She is doing well but does note occasional "waves" of fatigue.  She is tolerating Hydrea nicely and her counts continue to improve.  Hgb 13.0, WBC count is 11.5 and platelets 705.  No fever, chills, n/v, cough, rash, dizziness, SOB, chest pain, palpitations, abdominal pain or changes in bowel or bladder habits.  She has not had any recent Crohn's flares while on Entocort.  She is scheduled to start Remicade on 02/21/2021. She has heavy cycles at times due to uterine fibroids. No other blood loss. No bruising or petechiae.  No swelling, numbness or tingling in her extremities at this time.  She has a nodule and mild tenderness on her left wrist bone. No injury, redness or edema. She will continue to monitor and follow-up with PCP if persists.  No falls or syncope to report.  She has maintained a good appetite and is staying well hydrated throughout the day. Her wight is stable at 204 lbs.   ECOG Performance Status: 1 - Symptomatic but completely ambulatory  Medications:  Allergies as of 02/12/2021      Reactions   Amlodipine Swelling   Irbesartan-hydrochlorothiazide Other (See Comments)   Swelling    Valsartan-hydrochlorothiazide Other (See Comments)   Drowsy   Other Other (See Comments)   Olmesartan Medoxomil-hctz Other (See Comments)   Other reaction(s): Cough      Medication List       Accurate as of February 12, 2021  4:25 PM. If you have any questions, ask your nurse or doctor.        acetaminophen 650 MG CR tablet Commonly known as: TYLENOL Take by mouth.   budesonide 3 MG 24 hr  capsule Commonly known as: ENTOCORT EC Take 9 mg by mouth daily.   Edarbyclor 40-25 MG Tabs Generic drug: Azilsartan-Chlorthalidone Take 1 tablet by mouth daily.   FLINTSTONES W/IRON PO Take 1 tablet by mouth daily.   Fusion Plus Caps Take 1 tablet by mouth daily.   hydroxyurea 500 MG capsule Commonly known as: HYDREA Take 1 capsule (500 mg total) by mouth daily. May take with food to minimize GI side effects.   metoprolol tartrate 50 MG tablet Commonly known as: LOPRESSOR Take 50 mg by mouth 2 (two) times daily.   Potassium Chloride ER 20 MEQ Tbcr Take 1 tablet by mouth 2 (two) times daily.   Vitamin D 50 MCG (2000 UT) tablet Take 2,000 Units by mouth daily.       Allergies:  Allergies  Allergen Reactions  . Amlodipine Swelling  . Irbesartan-Hydrochlorothiazide Other (See Comments)    Swelling    . Valsartan-Hydrochlorothiazide Other (See Comments)    Drowsy   . Other Other (See Comments)  . Olmesartan Medoxomil-Hctz Other (See Comments)    Other reaction(s): Cough    Past Medical History, Surgical history, Social history, and Family History were reviewed and updated.  Review of Systems: All other 10 point review of systems is negative.   Physical Exam:  vitals were not taken for this visit.   Wt Readings from Last 3 Encounters:  12/18/20 200 lb 1.9 oz (90.8 kg)  11/07/20 199 lb (90.3 kg)  10/03/20 194 lb (88 kg)    Ocular: Sclerae unicteric, pupils equal, round and reactive to light Ear-nose-throat: Oropharynx clear, dentition fair Lymphatic: No cervical or supraclavicular adenopathy Lungs no rales or rhonchi, good excursion bilaterally Heart regular rate and rhythm, no murmur appreciated Abd soft, nontender, positive bowel sounds MSK no focal spinal tenderness, no joint edema Neuro: non-focal, well-oriented, appropriate affect Breasts: Deferred   Lab Results  Component Value Date   WBC 11.5 (H) 02/12/2021   HGB 13.0 02/12/2021   HCT 38.5  02/12/2021   MCV 99.5 02/12/2021   PLT 705 (H) 02/12/2021   Lab Results  Component Value Date   FERRITIN 36 12/18/2020   IRON 36 (L) 12/18/2020   TIBC 268 12/18/2020   UIBC 232 12/18/2020   IRONPCTSAT 13 (L) 12/18/2020   Lab Results  Component Value Date   RETICCTPCT 2.7 02/12/2021   RBC 3.92 02/12/2021   No results found for: KPAFRELGTCHN, LAMBDASER, KAPLAMBRATIO No results found for: IGGSERUM, IGA, IGMSERUM No results found for: Ronnald Ramp, A1GS, A2GS, Violet Baldy, MSPIKE, SPEI   Chemistry      Component Value Date/Time   NA 138 02/12/2021 1450   K 3.4 (L) 02/12/2021 1450   CL 99 02/12/2021 1450   CO2 34 (H) 02/12/2021 1450   BUN 10 02/12/2021 1450   CREATININE 0.74 02/12/2021 1450      Component Value Date/Time   CALCIUM 10.6 (H) 02/12/2021 1450   ALKPHOS 48 02/12/2021 1450   AST 15 02/12/2021 1450   ALT 15 02/12/2021 1450   BILITOT 0.5 02/12/2021 1450       Impression and Plan: Amy Johnson is a pleasant 55 yo African American with essential thrombocythemia, JAK2 positive, acquired VWB disease and iron deficiency secondary to intermittent GI blood loss with Crohn's.  She will continue her same regimen with Hydrea. Iron studies are pending. She will continue taking her Fusion plus daily.  Follow-up in 3 months.  She can contact our office with any questions or concerns.   Laverna Peace, NP 4/27/20224:25 PM

## 2021-02-13 ENCOUNTER — Telehealth: Payer: Self-pay | Admitting: *Deleted

## 2021-02-13 LAB — IRON AND TIBC
Iron: 40 ug/dL — ABNORMAL LOW (ref 41–142)
Saturation Ratios: 14 % — ABNORMAL LOW (ref 21–57)
TIBC: 280 ug/dL (ref 236–444)
UIBC: 240 ug/dL (ref 120–384)

## 2021-02-13 LAB — FERRITIN: Ferritin: 31 ng/mL (ref 11–307)

## 2021-02-14 ENCOUNTER — Telehealth: Payer: Self-pay | Admitting: *Deleted

## 2021-02-14 NOTE — Telephone Encounter (Signed)
Notified pt of results. Pt requesting to take  Oral iron tablets, have labs rechecked at next visit instead of IV Iron due to high cost.

## 2021-02-14 NOTE — Telephone Encounter (Signed)
-----   Message from Eliezer Bottom, NP sent at 02/14/2021  1:37 PM EDT ----- She can stop her oral supplement. It isn't helping. We will replace IV. Thank you!   ----- Message ----- From: Interface, [facility] [place] Sent: 02/12/2021   3:10 PM EDT To: Eliezer Bottom, NP

## 2021-02-17 ENCOUNTER — Telehealth: Payer: Self-pay

## 2021-02-17 NOTE — Telephone Encounter (Signed)
Called pt and left a vm to sch venofer x 5 doses per 02/14/21 sch message   Amy Johnson

## 2021-03-11 ENCOUNTER — Other Ambulatory Visit: Payer: Self-pay | Admitting: Family

## 2021-03-12 ENCOUNTER — Encounter: Payer: Self-pay | Admitting: Family

## 2021-03-20 ENCOUNTER — Other Ambulatory Visit: Payer: Self-pay | Admitting: *Deleted

## 2021-03-20 MED ORDER — FUSION PLUS PO CAPS: 1.0000 | ORAL_CAPSULE | Freq: Every day | ORAL | 3 refills | Status: DC

## 2021-03-20 NOTE — Telephone Encounter (Signed)
Pt called for refill on Iron PO as she is out of town left her medication at home and now has covid. Tested positive over memorial day weekend. Rx sent to [facility] in [place] per pt request. Encouraged hydration small meals w/protein, rest. Check tempeture and if symptoms worsen proceed to ED. Pt verbalized understanding no other concerns.

## 2021-03-20 NOTE — Telephone Encounter (Signed)
Pt lmovm with the following concerns: Refill on iron Pt out of town tested positive for covid, what does Dr. Marin Olp recommend  Returned call to pt unable to reach.unable to leave voicemail

## 2021-04-02 ENCOUNTER — Telehealth: Payer: Self-pay | Admitting: *Deleted

## 2021-04-02 NOTE — Telephone Encounter (Signed)
Returned patient's phone call regarding Hydrea. Patient stated,"I have an itching, tingling sensation ever since I started Hydrea. It is getting worse now. I feel like I need to scratch it." Per Laverna Peace, NP,  HOLD Hydrea for three days, then take it every other day until you come back July 27th for your appointments. Please call the office and let us know how you are feeling...has the itching gotten better, worse or the same. She verbalized understanding.

## 2021-04-09 ENCOUNTER — Other Ambulatory Visit: Payer: Self-pay | Admitting: Family Medicine

## 2021-04-09 DIAGNOSIS — Z1231 Encounter for screening mammogram for malignant neoplasm of breast: Secondary | ICD-10-CM

## 2021-05-05 ENCOUNTER — Other Ambulatory Visit: Payer: Self-pay | Admitting: Family

## 2021-05-08 ENCOUNTER — Telehealth: Payer: Self-pay | Admitting: *Deleted

## 2021-05-08 NOTE — Telephone Encounter (Signed)
Per Laverna Peace, NP, this nurse called patient and instructed her to start taking one Hydrea every day. This is based off her labs from Dr. Vidal Schwalbe office. Platelet count was 910. She verbalized understanding.

## 2021-05-12 ENCOUNTER — Telehealth: Payer: Self-pay | Admitting: *Deleted

## 2021-05-12 NOTE — Telephone Encounter (Signed)
Patient called asking if there was a problem with her getting her cavities filled.  No problem with this per Dr Marin Olp.  Left message on personal voice mail.

## 2021-05-14 ENCOUNTER — Encounter: Payer: Self-pay | Admitting: Family

## 2021-05-14 ENCOUNTER — Inpatient Hospital Stay: Payer: BC Managed Care – PPO | Admitting: Family

## 2021-05-14 ENCOUNTER — Telehealth: Payer: Self-pay | Admitting: *Deleted

## 2021-05-14 ENCOUNTER — Other Ambulatory Visit: Payer: Self-pay

## 2021-05-14 ENCOUNTER — Inpatient Hospital Stay: Payer: BC Managed Care – PPO | Attending: Hematology & Oncology

## 2021-05-14 VITALS — BP 123/62 | HR 70 | Temp 98.9°F | Resp 18 | Ht 68.0 in | Wt 198.0 lb

## 2021-05-14 DIAGNOSIS — D473 Essential (hemorrhagic) thrombocythemia: Secondary | ICD-10-CM | POA: Diagnosis present

## 2021-05-14 DIAGNOSIS — D6800 Von Willebrand disease, unspecified: Secondary | ICD-10-CM

## 2021-05-14 DIAGNOSIS — D509 Iron deficiency anemia, unspecified: Secondary | ICD-10-CM | POA: Insufficient documentation

## 2021-05-14 DIAGNOSIS — D500 Iron deficiency anemia secondary to blood loss (chronic): Secondary | ICD-10-CM

## 2021-05-14 DIAGNOSIS — K5090 Crohn's disease, unspecified, without complications: Secondary | ICD-10-CM | POA: Diagnosis not present

## 2021-05-14 LAB — CBC WITH DIFFERENTIAL (CANCER CENTER ONLY)
Abs Immature Granulocytes: 0.03 10*3/uL (ref 0.00–0.07)
Basophils Absolute: 0.1 10*3/uL (ref 0.0–0.1)
Basophils Relative: 1 %
Eosinophils Absolute: 0.2 10*3/uL (ref 0.0–0.5)
Eosinophils Relative: 1 %
HCT: 39.4 % (ref 36.0–46.0)
Hemoglobin: 13.1 g/dL (ref 12.0–15.0)
Immature Granulocytes: 0 %
Lymphocytes Relative: 20 %
Lymphs Abs: 2.4 10*3/uL (ref 0.7–4.0)
MCH: 33.5 pg (ref 26.0–34.0)
MCHC: 33.2 g/dL (ref 30.0–36.0)
MCV: 100.8 fL — ABNORMAL HIGH (ref 80.0–100.0)
Monocytes Absolute: 0.8 10*3/uL (ref 0.1–1.0)
Monocytes Relative: 7 %
Neutro Abs: 8.6 10*3/uL — ABNORMAL HIGH (ref 1.7–7.7)
Neutrophils Relative %: 71 %
Platelet Count: 898 10*3/uL — ABNORMAL HIGH (ref 150–400)
RBC: 3.91 MIL/uL (ref 3.87–5.11)
RDW: 13.1 % (ref 11.5–15.5)
WBC Count: 12 10*3/uL — ABNORMAL HIGH (ref 4.0–10.5)
nRBC: 0 % (ref 0.0–0.2)

## 2021-05-14 LAB — SAVE SMEAR(SSMR), FOR PROVIDER SLIDE REVIEW

## 2021-05-14 LAB — CMP (CANCER CENTER ONLY)
ALT: 14 U/L (ref 0–44)
AST: 14 U/L — ABNORMAL LOW (ref 15–41)
Albumin: 4.6 g/dL (ref 3.5–5.0)
Alkaline Phosphatase: 50 U/L (ref 38–126)
Anion gap: 7 (ref 5–15)
BUN: 15 mg/dL (ref 6–20)
CO2: 33 mmol/L — ABNORMAL HIGH (ref 22–32)
Calcium: 11.1 mg/dL — ABNORMAL HIGH (ref 8.9–10.3)
Chloride: 101 mmol/L (ref 98–111)
Creatinine: 0.99 mg/dL (ref 0.44–1.00)
GFR, Estimated: >60 mL/min (ref >60)
Glucose, Bld: 128 mg/dL — ABNORMAL HIGH (ref 70–99)
Potassium: 3.3 mmol/L — ABNORMAL LOW (ref 3.5–5.1)
Sodium: 141 mmol/L (ref 135–145)
Total Bilirubin: 0.5 mg/dL (ref 0.3–1.2)
Total Protein: 7.9 g/dL (ref 6.5–8.1)

## 2021-05-14 LAB — RETICULOCYTES
Immature Retic Fract: 14.1 % (ref 2.3–15.9)
RBC.: 3.91 MIL/uL (ref 3.87–5.11)
Retic Count, Absolute: 98.5 10*3/uL (ref 19.0–186.0)
Retic Ct Pct: 2.5 % (ref 0.4–3.1)

## 2021-05-14 LAB — LACTATE DEHYDROGENASE: LDH: 141 U/L (ref 98–192)

## 2021-05-14 NOTE — Progress Notes (Signed)
Hematology and Oncology Follow Up Visit  Amy Johnson [id number] [birth year] 55 y.o. 05/14/2021   Principle Diagnosis:  Essential thrombocythemia - JAK2 positive Acquired Von Willebrand Crohn's Disease Iron deficiency anemia   Current Therapy:        Hydrea 500 mg PO daily IV iron as indicated Folic acid - OTC   Interim History:  Amy Johnson is here today for follow-up. She is doing well but notes some fatigue at times.  We had reduced her to taking Hydrea 500 mg PO every other day due to tingling and burning sensation over the skin of her shoulders.   Last week she had lab work with her PCP which showed an increase in her platelet count to 910, Hgb 12.4, MCV 100 and WBC count 12.1.  She did restart her Hydrea last week at 500 mg PO daily. She has had a little tingling in her shoulders but no pain. She states that his has remained mild and tolerable.  No changes in her cycles. No other blood loss noted. No abnormal bruising, no petechiae.  No fever, chills, n/v, cough, rash, dizziness, SOB, chest pain, palpitations, abdominal pain or changes in bowel or bladder habits.  She has occasional abdominal bloating. This comes and goes. No recent Crohn's flare.  She notes tenderness, numbness and tingling in her first toe on her right foot. She did injure this to over 10 years ago.  She has a mole on the sole of her right foot. She will look into making a new patient appointment with dermatology for a routine skin check.  No swelling in her extremities at this time.  No falls or syncope to report.  She states that her appetite is good and she is staying well hydrated. Her weight is stable 198 lbs.  She has been enjoying her weeks off during summer and will start back the second week of August.   ECOG Performance Status: 1 - Symptomatic but completely ambulatory  Medications:  Allergies as of 05/14/2021       Reactions   Amlodipine Swelling   Irbesartan-hydrochlorothiazide Other (See  Comments)   Swelling    Valsartan-hydrochlorothiazide Other (See Comments)   Drowsy   Other Other (See Comments)   Olmesartan Medoxomil-hctz Other (See Comments)   Other reaction(s): Cough        Medication List        Accurate as of May 14, 2021  1:27 PM. If you have any questions, ask your nurse or doctor.          acetaminophen 650 MG CR tablet Commonly known as: TYLENOL Take by mouth.   budesonide 3 MG 24 hr capsule Commonly known as: ENTOCORT EC Take 9 mg by mouth daily.   Edarbyclor 40-25 MG Tabs Generic drug: Azilsartan-Chlorthalidone Take 1 tablet by mouth daily.   FLINTSTONES W/IRON PO Take 1 tablet by mouth daily.   Fusion Plus Caps Take 1 capsule by mouth daily. Take as directed: Pt to take 2 capsules by mouth daily.   hydroxyurea 500 MG capsule Commonly known as: HYDREA Take 1 capsule (500 mg total) by mouth daily. May take with food to minimize GI side effects.   metoprolol tartrate 50 MG tablet Commonly known as: LOPRESSOR Take 50 mg by mouth 2 (two) times daily.   Potassium Chloride ER 20 MEQ Tbcr Take 1 tablet by mouth 2 (two) times daily.   Vitamin D 50 MCG (2000 UT) tablet Take 2,000 Units by mouth daily.  Allergies:  Allergies  Allergen Reactions   Amlodipine Swelling   Irbesartan-Hydrochlorothiazide Other (See Comments)    Swelling     Valsartan-Hydrochlorothiazide Other (See Comments)    Drowsy    Other Other (See Comments)   Olmesartan Medoxomil-Hctz Other (See Comments)    Other reaction(s): Cough    Past Medical History, Surgical history, Social history, and Family History were reviewed and updated.  Review of Systems: All other 10 point review of systems is negative.   Physical Exam:  vitals were not taken for this visit.   Wt Readings from Last 3 Encounters:  02/12/21 204 lb 1.9 oz (92.6 kg)  12/18/20 200 lb 1.9 oz (90.8 kg)  11/07/20 199 lb (90.3 kg)    Ocular: Sclerae unicteric, pupils equal,  round and reactive to light Ear-nose-throat: Oropharynx clear, dentition fair Lymphatic: No cervical or supraclavicular adenopathy Lungs no rales or rhonchi, good excursion bilaterally Heart regular rate and rhythm, no murmur appreciated Abd soft, nontender, positive bowel sounds MSK no focal spinal tenderness, no joint edema Neuro: non-focal, well-oriented, appropriate affect Breasts: Deferred   Lab Results  Component Value Date   WBC 12.0 (H) 05/14/2021   HGB 13.1 05/14/2021   HCT 39.4 05/14/2021   MCV 100.8 (H) 05/14/2021   PLT 898 (H) 05/14/2021   Lab Results  Component Value Date   FERRITIN 31 02/12/2021   IRON 40 (L) 02/12/2021   TIBC 280 02/12/2021   UIBC 240 02/12/2021   IRONPCTSAT 14 (L) 02/12/2021   Lab Results  Component Value Date   RETICCTPCT 2.5 05/14/2021   RBC 3.91 05/14/2021   No results found for: KPAFRELGTCHN, LAMBDASER, KAPLAMBRATIO No results found for: IGGSERUM, IGA, IGMSERUM No results found for: Ronnald Ramp, A1GS, A2GS, Violet Baldy, MSPIKE, SPEI   Chemistry      Component Value Date/Time   NA 138 02/12/2021 1450   K 3.4 (L) 02/12/2021 1450   CL 99 02/12/2021 1450   CO2 34 (H) 02/12/2021 1450   BUN 10 02/12/2021 1450   CREATININE 0.74 02/12/2021 1450      Component Value Date/Time   CALCIUM 10.6 (H) 02/12/2021 1450   ALKPHOS 48 02/12/2021 1450   AST 15 02/12/2021 1450   ALT 15 02/12/2021 1450   BILITOT 0.5 02/12/2021 1450       Impression and Plan: Amy Johnson is a pleasant 55 yo African American with essential thrombocythemia, JAK2 positive, acquired VWB disease and iron deficiency secondary to intermittent GI blood loss with Crohn's.   She will continue taking her Hydrea 500 mg PO daily.  She will let us know prior to any surgical procedures so week can check a PTT beforehand.  Lab only in 6 weeks and follow-up with lab in 3 months.  She can contact our office with any questions or concerns.   Laverna Peace,  NP 7/27/20221:27 PM

## 2021-05-14 NOTE — Telephone Encounter (Signed)
Per 05/14/21 los - gave upcoming appointments to patient - confirmed - print info for [employer]

## 2021-05-15 ENCOUNTER — Telehealth: Payer: Self-pay | Admitting: *Deleted

## 2021-05-15 ENCOUNTER — Encounter: Payer: Self-pay | Admitting: Family

## 2021-05-15 LAB — FERRITIN: Ferritin: 34 ng/mL (ref 11–307)

## 2021-05-15 LAB — IRON AND TIBC
Iron: 64 ug/dL (ref 41–142)
Saturation Ratios: 22 % (ref 21–57)
TIBC: 295 ug/dL (ref 236–444)
UIBC: 231 ug/dL (ref 120–384)

## 2021-05-15 NOTE — Telephone Encounter (Signed)
-----   Message from Eliezer Bottom, NP sent at 05/15/2021  8:55 AM EDT ----- If she needs to have any dental extractions we will need to check her clotting time with PTT first due to the acquired Von Willebrand disease. Thank you!  Judson Roch

## 2021-05-15 NOTE — Telephone Encounter (Signed)
As noted below by Laverna Peace, NP, I informed the patient that if she has any dental cleanings or extractions, that we will need to check your clotting time with PTT first due to the acquired Von Willebrand disease. She verbalized understanding.

## 2021-06-02 ENCOUNTER — Ambulatory Visit: Payer: BC Managed Care – PPO

## 2021-06-02 ENCOUNTER — Ambulatory Visit
Admission: RE | Admit: 2021-06-02 | Discharge: 2021-06-02 | Disposition: A | Discharge status: Home / Self Care | Payer: BC Managed Care – PPO | Source: Ambulatory Visit | Attending: Family Medicine | Admitting: Family Medicine

## 2021-06-02 ENCOUNTER — Encounter: Payer: Self-pay | Admitting: Family

## 2021-06-02 ENCOUNTER — Other Ambulatory Visit: Payer: Self-pay

## 2021-06-02 DIAGNOSIS — Z1231 Encounter for screening mammogram for malignant neoplasm of breast: Secondary | ICD-10-CM

## 2021-06-27 ENCOUNTER — Other Ambulatory Visit: Payer: BC Managed Care – PPO

## 2021-07-04 ENCOUNTER — Other Ambulatory Visit: Payer: Self-pay | Admitting: *Deleted

## 2021-07-04 DIAGNOSIS — D500 Iron deficiency anemia secondary to blood loss (chronic): Secondary | ICD-10-CM

## 2021-07-04 DIAGNOSIS — D6800 Von Willebrand disease, unspecified: Secondary | ICD-10-CM

## 2021-07-04 DIAGNOSIS — D473 Essential (hemorrhagic) thrombocythemia: Secondary | ICD-10-CM

## 2021-07-07 ENCOUNTER — Inpatient Hospital Stay: Payer: BC Managed Care – PPO | Attending: Hematology & Oncology

## 2021-07-07 ENCOUNTER — Other Ambulatory Visit: Payer: Self-pay

## 2021-07-07 DIAGNOSIS — D6800 Von Willebrand disease, unspecified: Secondary | ICD-10-CM

## 2021-07-07 DIAGNOSIS — D500 Iron deficiency anemia secondary to blood loss (chronic): Secondary | ICD-10-CM

## 2021-07-07 DIAGNOSIS — D473 Essential (hemorrhagic) thrombocythemia: Secondary | ICD-10-CM | POA: Diagnosis not present

## 2021-07-07 LAB — CBC WITH DIFFERENTIAL (CANCER CENTER ONLY)
Abs Immature Granulocytes: 0.05 10*3/uL (ref 0.00–0.07)
Basophils Absolute: 0.1 10*3/uL (ref 0.0–0.1)
Basophils Relative: 1 %
Eosinophils Absolute: 0.2 10*3/uL (ref 0.0–0.5)
Eosinophils Relative: 2 %
HCT: 38.5 % (ref 36.0–46.0)
Hemoglobin: 13 g/dL (ref 12.0–15.0)
Immature Granulocytes: 0 %
Lymphocytes Relative: 20 %
Lymphs Abs: 2.7 10*3/uL (ref 0.7–4.0)
MCH: 33.4 pg (ref 26.0–34.0)
MCHC: 33.8 g/dL (ref 30.0–36.0)
MCV: 99 fL (ref 80.0–100.0)
Monocytes Absolute: 0.9 10*3/uL (ref 0.1–1.0)
Monocytes Relative: 7 %
Neutro Abs: 9.4 10*3/uL — ABNORMAL HIGH (ref 1.7–7.7)
Neutrophils Relative %: 70 %
Platelet Count: 765 10*3/uL — ABNORMAL HIGH (ref 150–400)
RBC: 3.89 MIL/uL (ref 3.87–5.11)
RDW: 14 % (ref 11.5–15.5)
WBC Count: 13.4 10*3/uL — ABNORMAL HIGH (ref 4.0–10.5)
nRBC: 0 % (ref 0.0–0.2)

## 2021-07-07 LAB — CMP (CANCER CENTER ONLY)
ALT: 17 U/L (ref 0–44)
AST: 17 U/L (ref 15–41)
Albumin: 4.3 g/dL (ref 3.5–5.0)
Alkaline Phosphatase: 54 U/L (ref 38–126)
Anion gap: 7 (ref 5–15)
BUN: 11 mg/dL (ref 6–20)
CO2: 33 mmol/L — ABNORMAL HIGH (ref 22–32)
Calcium: 10.1 mg/dL (ref 8.9–10.3)
Chloride: 99 mmol/L (ref 98–111)
Creatinine: 0.72 mg/dL (ref 0.44–1.00)
GFR, Estimated: >60 mL/min (ref >60)
Glucose, Bld: 106 mg/dL — ABNORMAL HIGH (ref 70–99)
Potassium: 3.3 mmol/L — ABNORMAL LOW (ref 3.5–5.1)
Sodium: 139 mmol/L (ref 135–145)
Total Bilirubin: 0.4 mg/dL (ref 0.3–1.2)
Total Protein: 7.3 g/dL (ref 6.5–8.1)

## 2021-07-08 LAB — IRON AND TIBC
Iron: 38 ug/dL — ABNORMAL LOW (ref 41–142)
Saturation Ratios: 14 % — ABNORMAL LOW (ref 21–57)
TIBC: 268 ug/dL (ref 236–444)
UIBC: 229 ug/dL (ref 120–384)

## 2021-07-08 LAB — FERRITIN: Ferritin: 46 ng/mL (ref 11–307)

## 2021-07-09 ENCOUNTER — Other Ambulatory Visit: Payer: BC Managed Care – PPO

## 2021-07-25 ENCOUNTER — Telehealth: Payer: Self-pay | Admitting: *Deleted

## 2021-07-25 NOTE — Telephone Encounter (Signed)
Returned phone call and notify patient of appoint schedule - requested callback to confirm

## 2021-08-06 ENCOUNTER — Encounter: Payer: Self-pay | Admitting: Hematology & Oncology

## 2021-08-06 ENCOUNTER — Other Ambulatory Visit: Payer: Self-pay

## 2021-08-06 ENCOUNTER — Inpatient Hospital Stay: Payer: BC Managed Care – PPO | Admitting: Hematology & Oncology

## 2021-08-06 ENCOUNTER — Inpatient Hospital Stay: Payer: BC Managed Care – PPO | Attending: Hematology & Oncology

## 2021-08-06 ENCOUNTER — Telehealth: Payer: Self-pay

## 2021-08-06 VITALS — BP 136/53 | HR 78 | Temp 98.9°F | Resp 17 | Wt 200.1 lb

## 2021-08-06 DIAGNOSIS — D473 Essential (hemorrhagic) thrombocythemia: Secondary | ICD-10-CM | POA: Diagnosis present

## 2021-08-06 DIAGNOSIS — D6804 Acquired von Willebrand disease: Secondary | ICD-10-CM | POA: Diagnosis not present

## 2021-08-06 DIAGNOSIS — D509 Iron deficiency anemia, unspecified: Secondary | ICD-10-CM | POA: Insufficient documentation

## 2021-08-06 DIAGNOSIS — D500 Iron deficiency anemia secondary to blood loss (chronic): Secondary | ICD-10-CM

## 2021-08-06 DIAGNOSIS — D6800 Von Willebrand disease, unspecified: Secondary | ICD-10-CM

## 2021-08-06 LAB — RETICULOCYTES
Immature Retic Fract: 17.1 % — ABNORMAL HIGH (ref 2.3–15.9)
RBC.: 3.49 MIL/uL — ABNORMAL LOW (ref 3.87–5.11)
Retic Count, Absolute: 104.7 10*3/uL (ref 19.0–186.0)
Retic Ct Pct: 3 % (ref 0.4–3.1)

## 2021-08-06 LAB — CBC WITH DIFFERENTIAL (CANCER CENTER ONLY)
Abs Immature Granulocytes: 0.16 10*3/uL — ABNORMAL HIGH (ref 0.00–0.07)
Basophils Absolute: 0.1 10*3/uL (ref 0.0–0.1)
Basophils Relative: 1 %
Eosinophils Absolute: 0.2 10*3/uL (ref 0.0–0.5)
Eosinophils Relative: 2 %
HCT: 35.2 % — ABNORMAL LOW (ref 36.0–46.0)
Hemoglobin: 11.7 g/dL — ABNORMAL LOW (ref 12.0–15.0)
Immature Granulocytes: 1 %
Lymphocytes Relative: 21 %
Lymphs Abs: 2.7 10*3/uL (ref 0.7–4.0)
MCH: 33.4 pg (ref 26.0–34.0)
MCHC: 33.2 g/dL (ref 30.0–36.0)
MCV: 100.6 fL — ABNORMAL HIGH (ref 80.0–100.0)
Monocytes Absolute: 0.9 10*3/uL (ref 0.1–1.0)
Monocytes Relative: 7 %
Neutro Abs: 8.5 10*3/uL — ABNORMAL HIGH (ref 1.7–7.7)
Neutrophils Relative %: 68 %
Platelet Count: 829 10*3/uL — ABNORMAL HIGH (ref 150–400)
RBC: 3.5 MIL/uL — ABNORMAL LOW (ref 3.87–5.11)
RDW: 14.8 % (ref 11.5–15.5)
WBC Count: 12.5 10*3/uL — ABNORMAL HIGH (ref 4.0–10.5)
nRBC: 0 % (ref 0.0–0.2)

## 2021-08-06 LAB — CMP (CANCER CENTER ONLY)
ALT: 16 U/L (ref 0–44)
AST: 17 U/L (ref 15–41)
Albumin: 4.3 g/dL (ref 3.5–5.0)
Alkaline Phosphatase: 54 U/L (ref 38–126)
Anion gap: 7 (ref 5–15)
BUN: 14 mg/dL (ref 6–20)
CO2: 33 mmol/L — ABNORMAL HIGH (ref 22–32)
Calcium: 10.6 mg/dL — ABNORMAL HIGH (ref 8.9–10.3)
Chloride: 101 mmol/L (ref 98–111)
Creatinine: 0.71 mg/dL (ref 0.44–1.00)
GFR, Estimated: >60 mL/min (ref >60)
Glucose, Bld: 155 mg/dL — ABNORMAL HIGH (ref 70–99)
Potassium: 3.3 mmol/L — ABNORMAL LOW (ref 3.5–5.1)
Sodium: 141 mmol/L (ref 135–145)
Total Bilirubin: 0.3 mg/dL (ref 0.3–1.2)
Total Protein: 7.4 g/dL (ref 6.5–8.1)

## 2021-08-06 LAB — LACTATE DEHYDROGENASE: LDH: 147 U/L (ref 98–192)

## 2021-08-06 LAB — APTT: aPTT: 38 seconds — ABNORMAL HIGH (ref 24–36)

## 2021-08-06 MED ORDER — HYDROXYUREA 500 MG PO CAPS: 500.0000 mg | ORAL_CAPSULE | Freq: Every day | ORAL | 3 refills | Status: DC

## 2021-08-06 NOTE — Telephone Encounter (Signed)
Received fax from Ama requesting a refill on Hydroxyurea. Spoke with Dr Marin Olp and he ok'd the refills. Rx sent.

## 2021-08-06 NOTE — Progress Notes (Signed)
Hematology and Oncology Follow Up Visit  Amy Johnson [id number] Oct 17, 1966 55 y.o. 08/06/2021   Principle Diagnosis:  Essential thrombocythemia - JAK2 positive Acquired Von Willebrand Crohn's Disease Iron deficiency anemia    Current Therapy:        Hydrea 500 mg PO daily Fusion Plus -- 1 po q day Folic acid - OTC   Interim History:  [name] is here today for follow-up.  We last saw her back in July.  She has had lab work done in September.  She was found to have some iron deficiency.  She will this is from her Crohn's disease.  At that time, her ferritin was 46 with an iron saturation of 14%.  She would prefer not to take IV iron.  Her platelet count is higher today.  She says she is taking the Hydrea.  I suspect that she probably is given the MCV.  It is possible that the thrombocytosis is in part from the Crohn's disease and lower iron.  She says that the Gastroenterologist want to try her on Remicade.  She just is reluctant to do this right now.  She currently is on Entocort.  It does not sound that she has had any flareups of the Crohn's disease.  She has had no bleeding.  She does have her monthly cycles.  She has had no rashes.  There has been no leg swelling.  She has had no fever.  There is been no problems with COVID.  Overall, I would say her performance status for now is probably ECOG 1.    Medications:  Allergies as of 08/06/2021       Reactions   Amlodipine Swelling   Irbesartan-hydrochlorothiazide Swelling   Valsartan-hydrochlorothiazide Other (See Comments)   Drowsy   Other Other (See Comments)   Olmesartan Medoxomil-hctz Cough        Medication List        Accurate as of August 06, 2021  4:14 PM. If you have any questions, ask your nurse or doctor.          acetaminophen 650 MG CR tablet Commonly known as: TYLENOL Take by mouth.   budesonide 3 MG 24 hr capsule Commonly known as: ENTOCORT EC Take 9 mg by mouth daily.    Edarbyclor 40-25 MG Tabs Generic drug: Azilsartan-Chlorthalidone Take 1 tablet by mouth daily.   FLINTSTONES W/IRON PO Take 1 tablet by mouth daily.   Fusion Plus Caps Take 1 capsule by mouth daily. Take as directed: Pt to take 2 capsules by mouth daily.   hydroxyurea 500 MG capsule Commonly known as: HYDREA Take 1 capsule (500 mg total) by mouth daily. May take with food to minimize GI side effects.   metoprolol tartrate 50 MG tablet Commonly known as: LOPRESSOR Take 50 mg by mouth 2 (two) times daily.   Potassium Chloride ER 20 MEQ Tbcr Take 1 tablet by mouth 2 (two) times daily.   Vitamin D 50 MCG (2000 UT) tablet Take 2,000 Units by mouth daily.        Allergies:  Allergies  Allergen Reactions   Amlodipine Swelling   Irbesartan-Hydrochlorothiazide Swelling   Valsartan-Hydrochlorothiazide Other (See Comments)    Drowsy    Other Other (See Comments)   Olmesartan Medoxomil-Hctz Cough    Past Medical History, Surgical history, Social history, and Family History were reviewed and updated.  Review of Systems: Review of Systems  Constitutional: Negative.   HENT: Negative.    Eyes: Negative.   Respiratory:  Negative.    Cardiovascular: Negative.   Gastrointestinal: Negative.   Genitourinary: Negative.   Musculoskeletal: Negative.   Skin: Negative.   Neurological: Negative.   Endo/Heme/Allergies: Negative.   Psychiatric/Behavioral: Negative.      Physical Exam:  weight is 200 lb 1.9 oz (90.8 kg). Her oral temperature is 98.9 F (37.2 C). Her blood pressure is 136/53 (abnormal) and her pulse is 78. Her respiration is 17 and oxygen saturation is 100%.   Wt Readings from Last 3 Encounters:  08/06/21 200 lb 1.9 oz (90.8 kg)  05/14/21 198 lb (89.8 kg)  02/12/21 204 lb 1.9 oz (92.6 kg)    Physical Exam Vitals reviewed.  HENT:     Head: Normocephalic and atraumatic.  Eyes:     Pupils: Pupils are equal, round, and reactive to light.  Cardiovascular:      Rate and Rhythm: Normal rate and regular rhythm.     Heart sounds: Normal heart sounds.  Pulmonary:     Effort: Pulmonary effort is normal.     Breath sounds: Normal breath sounds.  Abdominal:     General: Bowel sounds are normal.     Palpations: Abdomen is soft.  Musculoskeletal:        General: No tenderness or deformity. Normal range of motion.     Cervical back: Normal range of motion.  Lymphadenopathy:     Cervical: No cervical adenopathy.  Skin:    General: Skin is warm and dry.     Findings: No erythema or rash.  Neurological:     Mental Status: She is alert and oriented to person, place, and time.  Psychiatric:        Behavior: Behavior normal.        Thought Content: Thought content normal.        Judgment: Judgment normal.     Lab Results  Component Value Date   WBC 12.5 (H) 08/06/2021   HGB 11.7 (L) 08/06/2021   HCT 35.2 (L) 08/06/2021   MCV 100.6 (H) 08/06/2021   PLT 829 (H) 08/06/2021   Lab Results  Component Value Date   FERRITIN 46 07/07/2021   IRON 38 (L) 07/07/2021   TIBC 268 07/07/2021   UIBC 229 07/07/2021   IRONPCTSAT 14 (L) 07/07/2021   Lab Results  Component Value Date   RETICCTPCT 3.0 08/06/2021   RBC 3.50 (L) 08/06/2021   RBC 3.49 (L) 08/06/2021   No results found for: KPAFRELGTCHN, LAMBDASER, KAPLAMBRATIO No results found for: IGGSERUM, IGA, IGMSERUM No results found for: Odetta Pink, SPEI   Chemistry      Component Value Date/Time   NA 141 08/06/2021 1514   K 3.3 (L) 08/06/2021 1514   CL 101 08/06/2021 1514   CO2 33 (H) 08/06/2021 1514   BUN 14 08/06/2021 1514   CREATININE 0.71 08/06/2021 1514      Component Value Date/Time   CALCIUM 10.6 (H) 08/06/2021 1514   ALKPHOS 54 08/06/2021 1514   AST 17 08/06/2021 1514   ALT 16 08/06/2021 1514   BILITOT 0.3 08/06/2021 1514       Impression and Plan: [name] is a pleasant 55 yo African American with essential  thrombocythemia, JAK2 positive, acquired VWB disease and iron deficiency secondary to intermittent GI blood loss with Crohn's.    I think the iron studies will be critical.  If her iron studies are low, I told her to double up on the Fusion Plus.  Again she does  not wish to take IV iron.  If the iron levels are okay, then we may have to think about increasing her Hydrea to 500 mg p.o. twice daily.  She will keep thinking about taking Remicade for the Crohn's disease.  I do think we have to watch her closely.  We will probably have to get her back sometime in in December follow-up with her.  [name], MD 10/19/20224:14 PM

## 2021-08-07 ENCOUNTER — Telehealth: Payer: Self-pay

## 2021-08-07 ENCOUNTER — Other Ambulatory Visit: Payer: Self-pay

## 2021-08-07 LAB — IRON AND TIBC
Iron: 32 ug/dL — ABNORMAL LOW (ref 41–142)
Saturation Ratios: 12 % — ABNORMAL LOW (ref 21–57)
TIBC: 259 ug/dL (ref 236–444)
UIBC: 226 ug/dL (ref 120–384)

## 2021-08-07 LAB — FERRITIN: Ferritin: 45 ng/mL (ref 11–307)

## 2021-08-07 MED ORDER — FUSION PLUS PO CAPS: 2.0000 | ORAL_CAPSULE | Freq: Every day | ORAL | 3 refills | Status: DC

## 2021-08-07 NOTE — Telephone Encounter (Signed)
-----   Message from [name], MD sent at 08/07/2021  2:45 PM EDT ----- Please call her and tell her that the iron is very low again.  She needs to double up on the Fusion Plus.  We will see what her iron level is when we see her back in 6 weeks.  Hopefully this will help her high platelet count.  [name]

## 2021-08-07 NOTE — Telephone Encounter (Signed)
Patient informed of lab results and too double her fusion plus, she verbalized understanding and requested a refill on her fusion plus as well. Rx sent.

## 2021-08-08 ENCOUNTER — Telehealth: Payer: Self-pay | Admitting: *Deleted

## 2021-08-08 NOTE — Telephone Encounter (Signed)
Per staff message Jarrett Soho - called and was unable to lvm - mailed updated calendar

## 2021-09-30 ENCOUNTER — Other Ambulatory Visit: Payer: BC Managed Care – PPO

## 2021-09-30 ENCOUNTER — Ambulatory Visit: Payer: BC Managed Care – PPO | Admitting: Hematology & Oncology

## 2021-09-30 ENCOUNTER — Ambulatory Visit: Payer: BC Managed Care – PPO | Admitting: Family

## 2021-10-07 ENCOUNTER — Inpatient Hospital Stay: Payer: BC Managed Care – PPO | Admitting: Family

## 2021-10-07 ENCOUNTER — Other Ambulatory Visit: Payer: Self-pay

## 2021-10-07 ENCOUNTER — Encounter: Payer: Self-pay | Admitting: Family

## 2021-10-07 ENCOUNTER — Inpatient Hospital Stay: Payer: BC Managed Care – PPO | Attending: Hematology & Oncology

## 2021-10-07 VITALS — BP 147/70 | HR 65 | Temp 98.9°F | Resp 17 | Wt 201.8 lb

## 2021-10-07 DIAGNOSIS — D473 Essential (hemorrhagic) thrombocythemia: Secondary | ICD-10-CM | POA: Insufficient documentation

## 2021-10-07 DIAGNOSIS — D500 Iron deficiency anemia secondary to blood loss (chronic): Secondary | ICD-10-CM

## 2021-10-07 DIAGNOSIS — K5090 Crohn's disease, unspecified, without complications: Secondary | ICD-10-CM | POA: Diagnosis not present

## 2021-10-07 DIAGNOSIS — D509 Iron deficiency anemia, unspecified: Secondary | ICD-10-CM | POA: Insufficient documentation

## 2021-10-07 DIAGNOSIS — D6804 Acquired von Willebrand disease: Secondary | ICD-10-CM

## 2021-10-07 LAB — CBC WITH DIFFERENTIAL (CANCER CENTER ONLY)
Abs Immature Granulocytes: 0.05 10*3/uL (ref 0.00–0.07)
Basophils Absolute: 0.1 10*3/uL (ref 0.0–0.1)
Basophils Relative: 1 %
Eosinophils Absolute: 0.2 10*3/uL (ref 0.0–0.5)
Eosinophils Relative: 2 %
HCT: 34.8 % — ABNORMAL LOW (ref 36.0–46.0)
Hemoglobin: 11.6 g/dL — ABNORMAL LOW (ref 12.0–15.0)
Immature Granulocytes: 0 %
Lymphocytes Relative: 22 %
Lymphs Abs: 3 10*3/uL (ref 0.7–4.0)
MCH: 34.5 pg — ABNORMAL HIGH (ref 26.0–34.0)
MCHC: 33.3 g/dL (ref 30.0–36.0)
MCV: 103.6 fL — ABNORMAL HIGH (ref 80.0–100.0)
Monocytes Absolute: 0.9 10*3/uL (ref 0.1–1.0)
Monocytes Relative: 7 %
Neutro Abs: 9.2 10*3/uL — ABNORMAL HIGH (ref 1.7–7.7)
Neutrophils Relative %: 68 %
Platelet Count: 842 10*3/uL — ABNORMAL HIGH (ref 150–400)
RBC: 3.36 MIL/uL — ABNORMAL LOW (ref 3.87–5.11)
RDW: 13.7 % (ref 11.5–15.5)
WBC Count: 13.4 10*3/uL — ABNORMAL HIGH (ref 4.0–10.5)
nRBC: 0 % (ref 0.0–0.2)

## 2021-10-07 LAB — CMP (CANCER CENTER ONLY)
ALT: 19 U/L (ref 0–44)
AST: 19 U/L (ref 15–41)
Albumin: 4.3 g/dL (ref 3.5–5.0)
Alkaline Phosphatase: 46 U/L (ref 38–126)
Anion gap: 6 (ref 5–15)
BUN: 11 mg/dL (ref 6–20)
CO2: 33 mmol/L — ABNORMAL HIGH (ref 22–32)
Calcium: 10.5 mg/dL — ABNORMAL HIGH (ref 8.9–10.3)
Chloride: 101 mmol/L (ref 98–111)
Creatinine: 0.73 mg/dL (ref 0.44–1.00)
GFR, Estimated: >60 mL/min (ref >60)
Glucose, Bld: 91 mg/dL (ref 70–99)
Potassium: 3.6 mmol/L (ref 3.5–5.1)
Sodium: 140 mmol/L (ref 135–145)
Total Bilirubin: 0.5 mg/dL (ref 0.3–1.2)
Total Protein: 7.5 g/dL (ref 6.5–8.1)

## 2021-10-07 LAB — RETICULOCYTES
Immature Retic Fract: 22.5 % — ABNORMAL HIGH (ref 2.3–15.9)
RBC.: 3.35 MIL/uL — ABNORMAL LOW (ref 3.87–5.11)
Retic Count, Absolute: 135 10*3/uL (ref 19.0–186.0)
Retic Ct Pct: 4 % — ABNORMAL HIGH (ref 0.4–3.1)

## 2021-10-07 LAB — SAVE SMEAR(SSMR), FOR PROVIDER SLIDE REVIEW

## 2021-10-07 LAB — LACTATE DEHYDROGENASE: LDH: 157 U/L (ref 98–192)

## 2021-10-07 NOTE — Progress Notes (Signed)
Hematology and Oncology Follow Up Visit  Amy Johnson [id number] 07-Apr-1966 55 y.o. 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 is here today for follow-up. She is doing well but she does note fatigue.  Hgb is 11.6, MCV 103, WBC count 13.4 and platelets 829.  She is taking her Hydrea daily as prescribed.  Her iron has been low in the past. She is currently taking Fusion plus twice a day.  She notes brittle toe nails on her feet that seem to snag easily. She states that she has had issues with this before.  She has also noted dark pigmentation of her elbows and will try using a good emollient cream for hydration.  No fever, chills, n/v, cough, rash, dizziness, SOB, chest pain, palpitations, abdominal pain or changes in bowel or bladder habits.   She is still on the fence about starting treatment for Crohn's. She has spoken with GI and is considering her options. She is currently on Entocort EC.  No obvious blood loss noted. No bruising or petechiae.  No swelling, numbness or tingling in her extremities at this time.  No falls or syncope to report.  She has a good appetite and is doing her best to stay well hydrated throughout the day. Her weight is stable at 201 lbs.   ECOG Performance Status: 1 - Symptomatic but completely ambulatory  Medications:  Allergies as of 10/07/2021       Reactions   Amlodipine Swelling   Irbesartan-hydrochlorothiazide Swelling   Valsartan-hydrochlorothiazide Other (See Comments)   Drowsy   Other Other (See Comments)   Olmesartan Medoxomil-hctz Cough        Medication List        Accurate as of October 07, 2021  2:29 PM. If you have any questions, ask your nurse or doctor.           acetaminophen 650 MG CR tablet Commonly known as: TYLENOL Take by mouth.   budesonide 3 MG 24 hr capsule Commonly known as: ENTOCORT EC Take 9 mg by mouth daily.   Edarbyclor 40-25 MG Tabs Generic drug: Azilsartan-Chlorthalidone Take 1 tablet by mouth daily.   FLINTSTONES W/IRON PO Take 1 tablet by mouth daily.   Fusion Plus Caps Take 2 capsules by mouth daily. Take as directed: Pt to take 2 capsules by mouth daily.   hydroxyurea 500 MG capsule Commonly known as: HYDREA Take 1 capsule (500 mg total) by mouth daily. May take with food to minimize GI side effects.   metoprolol tartrate 50 MG tablet Commonly known as: LOPRESSOR Take 50 mg by mouth 2 (two) times daily.   Potassium Chloride ER 20 MEQ Tbcr Take 1 tablet by mouth 2 (two) times daily.        Allergies:  Allergies  Allergen Reactions   Amlodipine Swelling   Irbesartan-Hydrochlorothiazide Swelling   Valsartan-Hydrochlorothiazide Other (See Comments)    Drowsy    Other Other (See Comments)   Olmesartan Medoxomil-Hctz Cough    Past Medical History, Surgical history, Social history, and Family History were reviewed and updated.  Review of Systems: All other 10 point review of systems is negative.   Physical Exam:  weight is 201 lb 12.8 oz (91.5  kg). Her oral temperature is 98.9 F (37.2 C). Her blood pressure is 147/70 (abnormal) and her pulse is 65. Her respiration is 17 and oxygen saturation is 100%.   Wt Readings from Last 3 Encounters:  10/07/21 201 lb 12.8 oz (91.5 kg)  08/06/21 200 lb 1.9 oz (90.8 kg)  05/14/21 198 lb (89.8 kg)    Ocular: Sclerae unicteric, pupils equal, round and reactive to light Ear-nose-throat: Oropharynx clear, dentition fair Lymphatic: No cervical or supraclavicular adenopathy Lungs no rales or rhonchi, good excursion bilaterally Heart regular rate and rhythm, no murmur appreciated Abd soft, nontender, positive bowel sounds MSK no focal spinal tenderness, no joint  edema Neuro: non-focal, well-oriented, appropriate affect Breasts: Deferred   Lab Results  Component Value Date   WBC 13.4 (H) 10/07/2021   HGB 11.6 (L) 10/07/2021   HCT 34.8 (L) 10/07/2021   MCV 103.6 (H) 10/07/2021   PLT 842 (H) 10/07/2021   Lab Results  Component Value Date   FERRITIN 45 08/06/2021   IRON 32 (L) 08/06/2021   TIBC 259 08/06/2021   UIBC 226 08/06/2021   IRONPCTSAT 12 (L) 08/06/2021   Lab Results  Component Value Date   RETICCTPCT 4.0 (H) 10/07/2021   RBC 3.35 (L) 10/07/2021   No results found for: KPAFRELGTCHN, LAMBDASER, KAPLAMBRATIO No results found for: IGGSERUM, IGA, IGMSERUM No results found for: Odetta Pink, SPEI   Chemistry      Component Value Date/Time   NA 141 08/06/2021 1514   K 3.3 (L) 08/06/2021 1514   CL 101 08/06/2021 1514   CO2 33 (H) 08/06/2021 1514   BUN 14 08/06/2021 1514   CREATININE 0.71 08/06/2021 1514      Component Value Date/Time   CALCIUM 10.6 (H) 08/06/2021 1514   ALKPHOS 54 08/06/2021 1514   AST 17 08/06/2021 1514   ALT 16 08/06/2021 1514   BILITOT 0.3 08/06/2021 1514       Impression and Plan: [name] is a pleasant 55 yo African American with essential thrombocythemia, JAK2 positive, acquired VWB disease and iron deficiency secondary to intermittent GI blood loss with Crohn's.   Iron studies are pending.  Platelets and WBC count are still elevated. Discussed with Dr. Marin Olp and we will increase her Hydrea to 500 Mg PO BID. Patient verbalized understanding and agreement with the plan.  Follow-up in 8 weeks.   Lottie Dawson, NP 12/20/20222:29 PM

## 2021-10-08 ENCOUNTER — Inpatient Hospital Stay: Payer: BC Managed Care – PPO | Admitting: Hematology & Oncology

## 2021-10-08 ENCOUNTER — Inpatient Hospital Stay: Payer: BC Managed Care – PPO

## 2021-10-08 LAB — IRON AND IRON BINDING CAPACITY (CC-WL,HP ONLY)
Iron: 47 ug/dL (ref 28–170)
Saturation Ratios: 16 % (ref 10.4–31.8)
TIBC: 294 ug/dL (ref 250–450)
UIBC: 247 ug/dL (ref 148–442)

## 2021-10-08 LAB — FERRITIN: Ferritin: 46 ng/mL (ref 11–307)

## 2021-10-09 ENCOUNTER — Telehealth: Payer: Self-pay | Admitting: *Deleted

## 2021-10-09 NOTE — Telephone Encounter (Signed)
No 10/09/21 los

## 2021-10-10 ENCOUNTER — Encounter: Payer: Self-pay | Admitting: Family

## 2021-10-10 MED ORDER — HYDROXYUREA 500 MG PO CAPS: 500.0000 mg | ORAL_CAPSULE | Freq: Two times a day (BID) | ORAL | 3 refills | Status: DC

## 2021-10-14 ENCOUNTER — Telehealth: Payer: Self-pay | Admitting: *Deleted

## 2021-10-14 NOTE — Telephone Encounter (Signed)
Per 10/07/21 los entered late by Judson Roch - called and gave upcoming appointments - confirmed

## 2021-11-14 ENCOUNTER — Telehealth: Payer: Self-pay

## 2021-11-14 NOTE — Telephone Encounter (Signed)
Received phone call from patient in reference to her iron results drawn by her PCP and talking with the nurse from her PCP office. Pt asking what to do about her Iron level 246. Reviewed pt labs with Lottie Dawson, NP who recommends that patient stop taking her oral iron supplement. Pt states that she decreased her oral iron supplement from BID to once daily in December. Pt had many questions about her iron results and how they affected her body and said that she was going to talk to her PCP next week. This RN stated that at this time the recommendation from our office was to stop the oral iron supplement but to let us know if she discusses this with her PCP and if she makes a different decision in reference to her iron. Pt then stated that her oral iron supplement had other supplements in that and what to do. Pt advised that we are unable to make a recommendation on other supplements but at this time to stop her oral iron supplement and to bring all her medications to her appointment on 12/12/2021 to be reviewed. Appointments were then reviewed with patient. Pt verbalized understanding and had no further questions. Pt aware to call office with any further questions or concerns.

## 2021-11-14 NOTE — Telephone Encounter (Signed)
Received labs from patient's PCP with platelet result of 917. Dr. Marin Olp aware and per his request; verify with patient that she is taking her Hydrea 500 mg twice daily. Reached out to patient who stated she has not been taking her Hydrea twice daily due to it making her feel drowsy and she does not like to feel that way at work. Dr. Marin Olp aware and to prevent patient from feeling drowsy at work he instructed patient to take full dose of Hydrea at bedtime. This RN called patient and instructed patient to start taking Hydrea 1000 mg once daily at bedtime to prevent her from feeling drowsy at work. Pt verbalized understanding and stated she would start taking the medicine at night starting tomorrow 11/15/2021. Pt aware that she will continue to take the medicine this way until her appointment on 12/12/2021 to re-evaluate. Pt aware she will see Dr. Marin Olp on that date also. Pt verbalized understanding and had no further questions. Pt appreciative of call and aware of next appointment date. Pt aware to call clinic with any questions or concerns.

## 2021-12-12 ENCOUNTER — Inpatient Hospital Stay: Payer: BC Managed Care – PPO | Admitting: Hematology & Oncology

## 2021-12-12 ENCOUNTER — Other Ambulatory Visit: Payer: Self-pay

## 2021-12-12 ENCOUNTER — Inpatient Hospital Stay: Payer: BC Managed Care – PPO | Attending: Hematology & Oncology

## 2021-12-12 VITALS — BP 151/77 | HR 88 | Temp 99.0°F | Resp 17 | Ht 69.0 in | Wt 204.1 lb

## 2021-12-12 DIAGNOSIS — D6804 Acquired von Willebrand disease: Secondary | ICD-10-CM | POA: Insufficient documentation

## 2021-12-12 DIAGNOSIS — D509 Iron deficiency anemia, unspecified: Secondary | ICD-10-CM | POA: Insufficient documentation

## 2021-12-12 DIAGNOSIS — D473 Essential (hemorrhagic) thrombocythemia: Secondary | ICD-10-CM | POA: Diagnosis not present

## 2021-12-12 DIAGNOSIS — K5090 Crohn's disease, unspecified, without complications: Secondary | ICD-10-CM | POA: Diagnosis not present

## 2021-12-12 DIAGNOSIS — D500 Iron deficiency anemia secondary to blood loss (chronic): Secondary | ICD-10-CM

## 2021-12-12 DIAGNOSIS — D6800 Von Willebrand disease, unspecified: Secondary | ICD-10-CM

## 2021-12-12 LAB — CBC WITH DIFFERENTIAL (CANCER CENTER ONLY)
Abs Immature Granulocytes: 0.03 10*3/uL (ref 0.00–0.07)
Basophils Absolute: 0.1 10*3/uL (ref 0.0–0.1)
Basophils Relative: 1 %
Eosinophils Absolute: 0.1 10*3/uL (ref 0.0–0.5)
Eosinophils Relative: 1 %
HCT: 32.3 % — ABNORMAL LOW (ref 36.0–46.0)
Hemoglobin: 10.8 g/dL — ABNORMAL LOW (ref 12.0–15.0)
Immature Granulocytes: 0 %
Lymphocytes Relative: 22 %
Lymphs Abs: 2.5 10*3/uL (ref 0.7–4.0)
MCH: 34.7 pg — ABNORMAL HIGH (ref 26.0–34.0)
MCHC: 33.4 g/dL (ref 30.0–36.0)
MCV: 103.9 fL — ABNORMAL HIGH (ref 80.0–100.0)
Monocytes Absolute: 0.7 10*3/uL (ref 0.1–1.0)
Monocytes Relative: 6 %
Neutro Abs: 7.7 10*3/uL (ref 1.7–7.7)
Neutrophils Relative %: 70 %
Platelet Count: 731 10*3/uL — ABNORMAL HIGH (ref 150–400)
RBC: 3.11 MIL/uL — ABNORMAL LOW (ref 3.87–5.11)
RDW: 13.8 % (ref 11.5–15.5)
WBC Count: 11.2 10*3/uL — ABNORMAL HIGH (ref 4.0–10.5)
nRBC: 0 % (ref 0.0–0.2)

## 2021-12-12 LAB — CMP (CANCER CENTER ONLY)
ALT: 14 U/L (ref 0–44)
AST: 14 U/L — ABNORMAL LOW (ref 15–41)
Albumin: 4 g/dL (ref 3.5–5.0)
Alkaline Phosphatase: 45 U/L (ref 38–126)
Anion gap: 8 (ref 5–15)
BUN: 13 mg/dL (ref 6–20)
CO2: 30 mmol/L (ref 22–32)
Calcium: 9.8 mg/dL (ref 8.9–10.3)
Chloride: 101 mmol/L (ref 98–111)
Creatinine: 0.79 mg/dL (ref 0.44–1.00)
GFR, Estimated: >60 mL/min (ref >60)
Glucose, Bld: 133 mg/dL — ABNORMAL HIGH (ref 70–99)
Potassium: 3 mmol/L — ABNORMAL LOW (ref 3.5–5.1)
Sodium: 139 mmol/L (ref 135–145)
Total Bilirubin: 0.3 mg/dL (ref 0.3–1.2)
Total Protein: 7.2 g/dL (ref 6.5–8.1)

## 2021-12-12 LAB — RETICULOCYTES
Immature Retic Fract: 20.2 % — ABNORMAL HIGH (ref 2.3–15.9)
RBC.: 3.1 MIL/uL — ABNORMAL LOW (ref 3.87–5.11)
Retic Count, Absolute: 112.8 10*3/uL (ref 19.0–186.0)
Retic Ct Pct: 3.6 % — ABNORMAL HIGH (ref 0.4–3.1)

## 2021-12-12 LAB — APTT: aPTT: 36 seconds (ref 24–36)

## 2021-12-12 LAB — LACTATE DEHYDROGENASE: LDH: 126 U/L (ref 98–192)

## 2021-12-12 LAB — SAVE SMEAR(SSMR), FOR PROVIDER SLIDE REVIEW

## 2021-12-12 NOTE — Progress Notes (Signed)
Hematology and Oncology Follow Up Visit  Amy Johnson [id number] 1966-09-06 56 y.o. 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  She seems to be doing okay.  She has not had any problems with the Crohn's disease.  She still has not going to take the Remicade.  We did increase Amy Johnson dose of Hydrea when we last saw Amy Johnson.  She is doing well with the Hydrea without any nausea or vomiting.  She has had no diarrhea.  There is been no bleeding.  She is taking some supplements.  Again, I do not think that the supplements are going to affect the thrombocythemia.  She has had no bleeding.  I know she has an acquired von Willebrand.  Amy Johnson PTT today was 36 seconds.  We will see what Amy Johnson von Willebrand levels are.  She still has Amy Johnson monthly cycles.  She is trying to watch what she eats.  There is been no fever.  She has had no problems with COVID.  She has been on iron supplementation.  She had iron levels done at Amy Johnson family doctor's office.  She had an iron saturation of 85%.  We went ahead and stopped the iron on Amy Johnson.  This seems to be a little bit unusual in all honesty.  Back in December when we saw Amy Johnson, Amy Johnson iron saturation was only 16%.  Currently, I would say performance status is probably ECOG 1.    Medications:  Allergies as of 12/12/2021       Reactions   Amlodipine Swelling   Irbesartan-hydrochlorothiazide Swelling   Valsartan-hydrochlorothiazide Other (See Comments)   Drowsy   Other Other (See Comments)   Olmesartan Medoxomil-hctz Cough        Medication List        Accurate as of December 12, 2021  3:50 PM. If you have any questions, ask your nurse or doctor.          acetaminophen  650 MG CR tablet Commonly known as: TYLENOL Take by mouth.   budesonide 3 MG 24 hr capsule Commonly known as: ENTOCORT EC Take 9 mg by mouth daily.   Edarbyclor 40-25 MG Tabs Generic drug: Azilsartan-Chlorthalidone Take 1 tablet by mouth daily.   FLINTSTONES W/IRON PO Take 1 tablet by mouth daily.   Fusion Plus Caps Take 2 capsules by mouth daily. Take as directed: Pt to take 2 capsules by mouth daily.   hydroxyurea 500 MG capsule Commonly known as: HYDREA Take 1 capsule (500 mg total) by mouth 2 (two) times daily. May take with food to minimize GI side effects.   metoprolol tartrate 50 MG tablet Commonly known as: LOPRESSOR Take 50 mg by mouth 2 (two) times daily.   Potassium Chloride ER 20 MEQ Tbcr Take 1 tablet by mouth 2 (two) times daily.        Allergies:  Allergies  Allergen Reactions   Amlodipine Swelling   Irbesartan-Hydrochlorothiazide Swelling   Valsartan-Hydrochlorothiazide Other (See Comments)    Drowsy    Other Other (See Comments)   Olmesartan Medoxomil-Hctz Cough    Past Medical History, Surgical history, Social history, and Family History were reviewed and updated.  Review of Systems: Review  of Systems  HENT: Negative.    Eyes: Negative.   Respiratory: Negative.    Cardiovascular: Negative.   Gastrointestinal: Negative.   Genitourinary: Negative.   Musculoskeletal: Negative.   Skin: Negative.   Neurological: Negative.   Endo/Heme/Allergies: Negative.   Psychiatric/Behavioral: Negative.      Physical Exam:  height is 5\' 9"  (1.753 m) and weight is 204 lb 1.9 oz (92.6 kg). Amy Johnson oral temperature is 99 F (37.2 C). Amy Johnson blood pressure is 151/77 (abnormal) and Amy Johnson pulse is 88. Amy Johnson respiration is 17 and oxygen saturation is 98%.   Wt Readings from Last 3 Encounters:  12/12/21 204 lb 1.9 oz (92.6 kg)  10/07/21 201 lb 12.8 oz (91.5 kg)  08/06/21 200 lb 1.9 oz (90.8 kg)   Physical Exam Vitals reviewed.  HENT:     Head: Normocephalic and  atraumatic.  Eyes:     Pupils: Pupils are equal, round, and reactive to light.  Cardiovascular:     Rate and Rhythm: Normal rate and regular rhythm.     Heart sounds: Normal heart sounds.  Pulmonary:     Effort: Pulmonary effort is normal.     Breath sounds: Normal breath sounds.  Abdominal:     General: Bowel sounds are normal.     Palpations: Abdomen is soft.  Musculoskeletal:        General: No tenderness or deformity. Normal range of motion.     Cervical back: Normal range of motion.  Lymphadenopathy:     Cervical: No cervical adenopathy.  Skin:    General: Skin is warm and dry.     Findings: No erythema or rash.  Neurological:     Mental Status: She is alert and oriented to person, place, and time.  Psychiatric:        Behavior: Behavior normal.        Thought Content: Thought content normal.        Judgment: Judgment normal.     Lab Results  Component Value Date   WBC 11.2 (H) 12/12/2021   HGB 10.8 (L) 12/12/2021   HCT 32.3 (L) 12/12/2021   MCV 103.9 (H) 12/12/2021   PLT 731 (H) 12/12/2021   Lab Results  Component Value Date   FERRITIN 46 10/07/2021   IRON 47 10/07/2021   TIBC 294 10/07/2021   UIBC 247 10/07/2021   IRONPCTSAT 16 10/07/2021   Lab Results  Component Value Date   RETICCTPCT 3.6 (H) 12/12/2021   RBC 3.11 (L) 12/12/2021   RBC 3.10 (L) 12/12/2021   No results found for: KPAFRELGTCHN, LAMBDASER, KAPLAMBRATIO No results found for: IGGSERUM, IGA, IGMSERUM No results found for: Amy Johnson, SPEI   Chemistry      Component Value Date/Time   NA 139 12/12/2021 1508   K 3.0 (L) 12/12/2021 1508   CL 101 12/12/2021 1508   CO2 30 12/12/2021 1508   BUN 13 12/12/2021 1508   CREATININE 0.79 12/12/2021 1508      Component Value Date/Time   CALCIUM 9.8 12/12/2021 1508   ALKPHOS 45 12/12/2021 1508   AST 14 (L) 12/12/2021 1508   ALT 14 12/12/2021 1508   BILITOT 0.3 12/12/2021 1508        Impression and Plan: Amy Johnson is a pleasant 56 yo African American with essential thrombocythemia.  Amy Johnson thrombocythemia is JAK2 positive.  Para there was some acquired von Willebrand disease more for saw Amy Johnson.  We are rechecking this.  Again will be interesting  to see what the iron studies look like.  She is always had low iron.  I will see Amy Johnson back in about 2 or 3 months.  Hopefully, we will see Amy Johnson platelet count continue to improve.   [name], MD 2/24/20233:50 PM

## 2021-12-15 ENCOUNTER — Telehealth: Payer: Self-pay | Admitting: Hematology & Oncology

## 2021-12-15 LAB — FERRITIN: Ferritin: 12 ng/mL (ref 11–307)

## 2021-12-15 LAB — VON WILLEBRAND PANEL
Coagulation Factor VIII: 89 % (ref 56–140)
Ristocetin Co-factor, Plasma: 32 % — ABNORMAL LOW (ref 50–200)
Von Willebrand Antigen, Plasma: 85 % (ref 50–200)

## 2021-12-15 LAB — IRON AND IRON BINDING CAPACITY (CC-WL,HP ONLY)
Iron: 26 ug/dL — ABNORMAL LOW (ref 28–170)
Saturation Ratios: 7 % — ABNORMAL LOW (ref 10.4–31.8)
TIBC: 350 ug/dL (ref 250–450)
UIBC: 324 ug/dL (ref 148–442)

## 2021-12-15 LAB — COAG STUDIES INTERP REPORT

## 2021-12-15 NOTE — Telephone Encounter (Signed)
Called to schedule per los, patient did not answer and VM full, unable to leave voicemail

## 2021-12-16 ENCOUNTER — Telehealth: Payer: Self-pay

## 2021-12-16 NOTE — Telephone Encounter (Signed)
-----   Message from [name], MD sent at 12/15/2021  1:53 PM EST ----- Please call her and let him know that the iron is incredibly low.  She needs IV iron.  Please set her up.  Thanks.  [name]

## 2021-12-16 NOTE — Telephone Encounter (Signed)
Spoke with pt and advised her of her lab results. Pt does not want to get an Iron infusion but instead would like to start back on her Fusion Plus Rx. Pt would like to know if she should take 1 or 2 tablets a day. Advised pt we would follow up with Dr Marin Olp when he was back in the office and let her know.

## 2021-12-16 NOTE — Telephone Encounter (Signed)
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

## 2021-12-17 ENCOUNTER — Telehealth: Payer: Self-pay

## 2021-12-17 NOTE — Telephone Encounter (Signed)
Per Dr Marin Olp ok to start taking Fusion Plus 1 tablet EOD.  ?

## 2021-12-17 NOTE — Telephone Encounter (Signed)
Informed pt to take the fusion plus 1 tablet every other day starting today. Pt appreciative of call and had no further questions. Pt aware to call clinic with any questions or concerns. Pt verbalized understanding.  ?

## 2021-12-17 NOTE — Telephone Encounter (Signed)
Attempted to call patient and leave a voicemail but the mailbox was full and not accepting any messages. Attempting to call patient and let her know that after discussion with Dr. Marin Olp it is ok for patient to take one Fusion Plus tablet every other day. Will attempt to get in touch with patient later today.  ?

## 2022-02-28 IMAGING — MG MM DIGITAL SCREENING BILAT W/ TOMO AND CAD
8 series · 8 of 24 positions shown · non-contrast
Comparison: Previous exam(s).

CLINICAL DATA: Screening.

EXAM:
DIGITAL SCREENING BILATERAL MAMMOGRAM WITH TOMOSYNTHESIS AND CAD
TECHNIQUE: Bilateral screening digital craniocaudal and mediolateral oblique
mammograms were obtained. Bilateral screening digital breast
tomosynthesis was performed. The images were evaluated with
computer-aided detection.

[L MLO synth-2D]
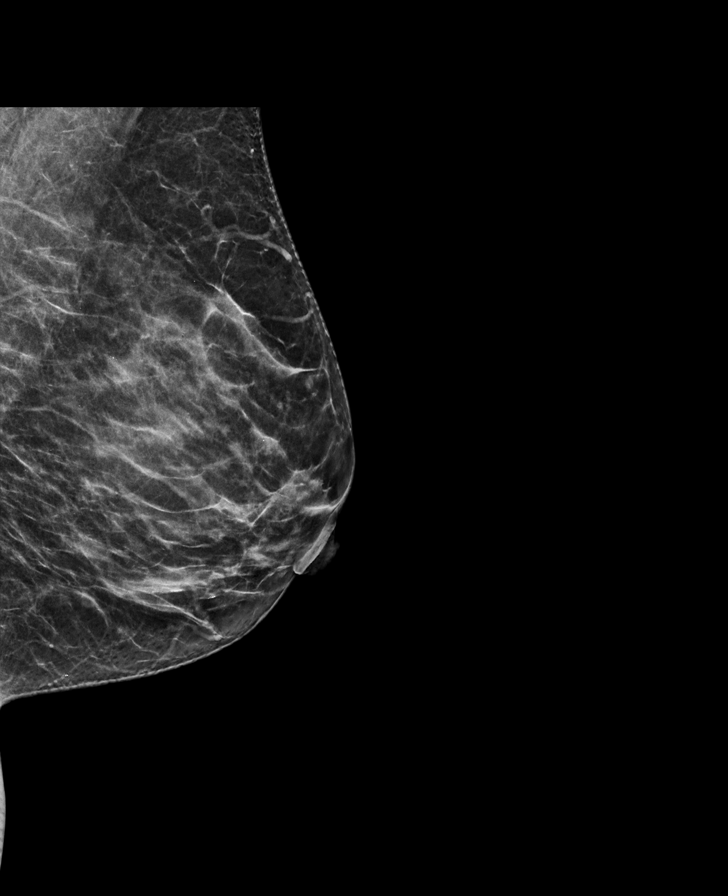

[R CC synth-2D]
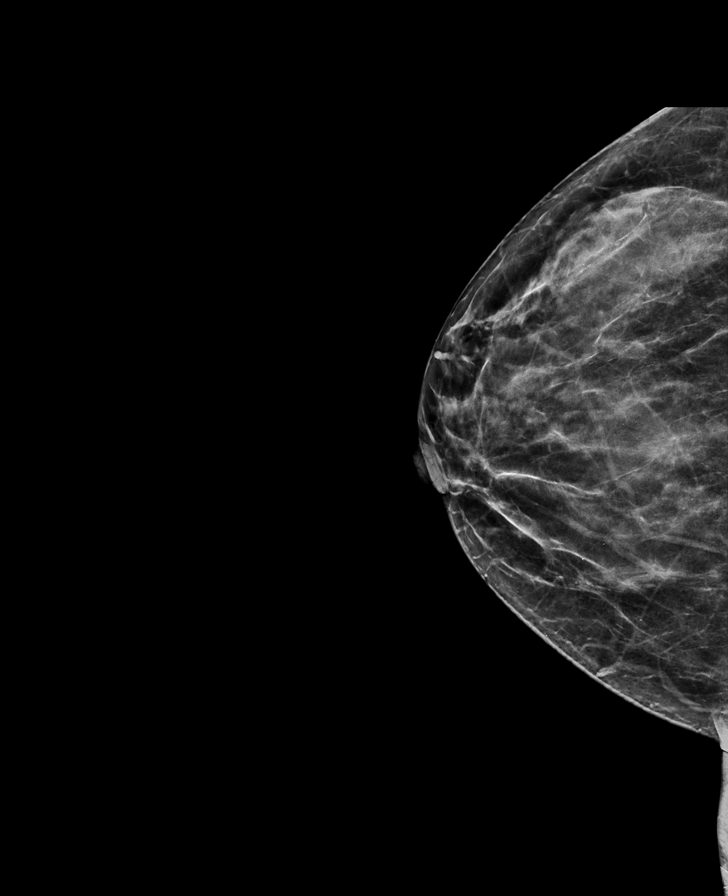

[R MLO synth-2D]
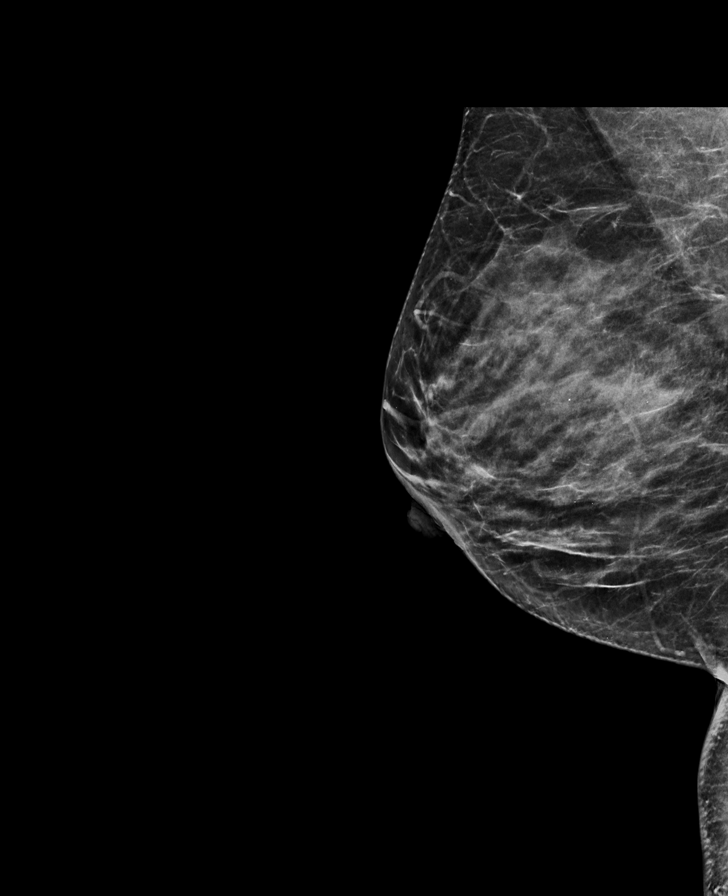

[L CC synth-2D]
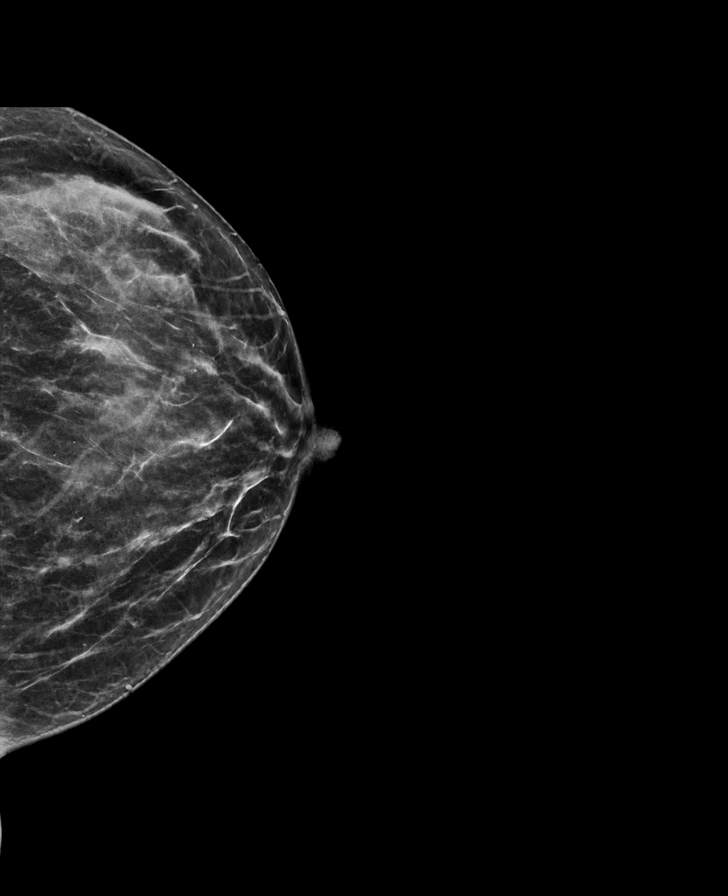

[L CC tomo · tomo slice 31/62.0]
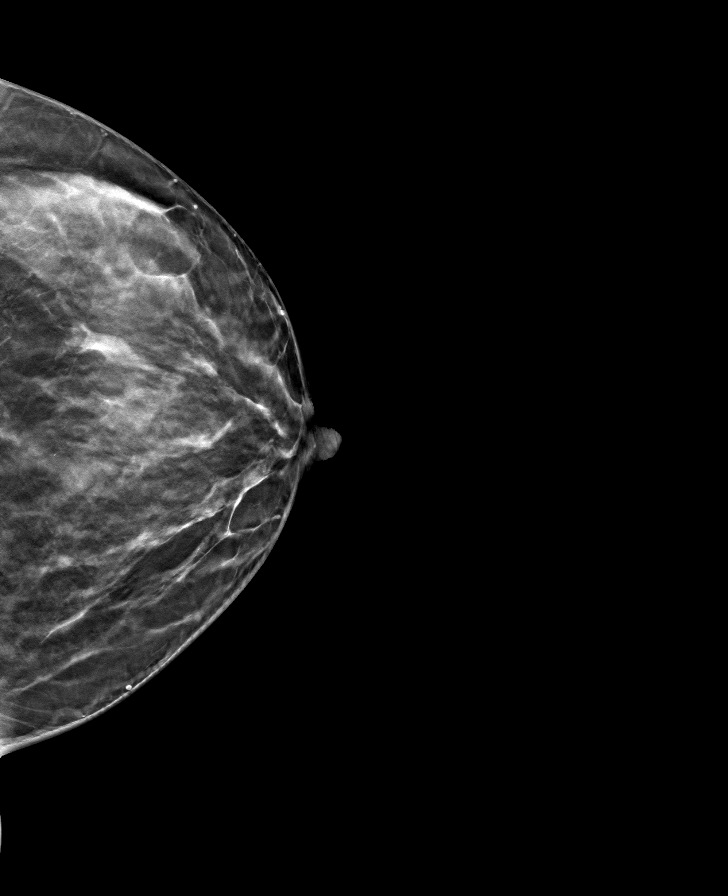

[R CC tomo · tomo slice 31/61.0]
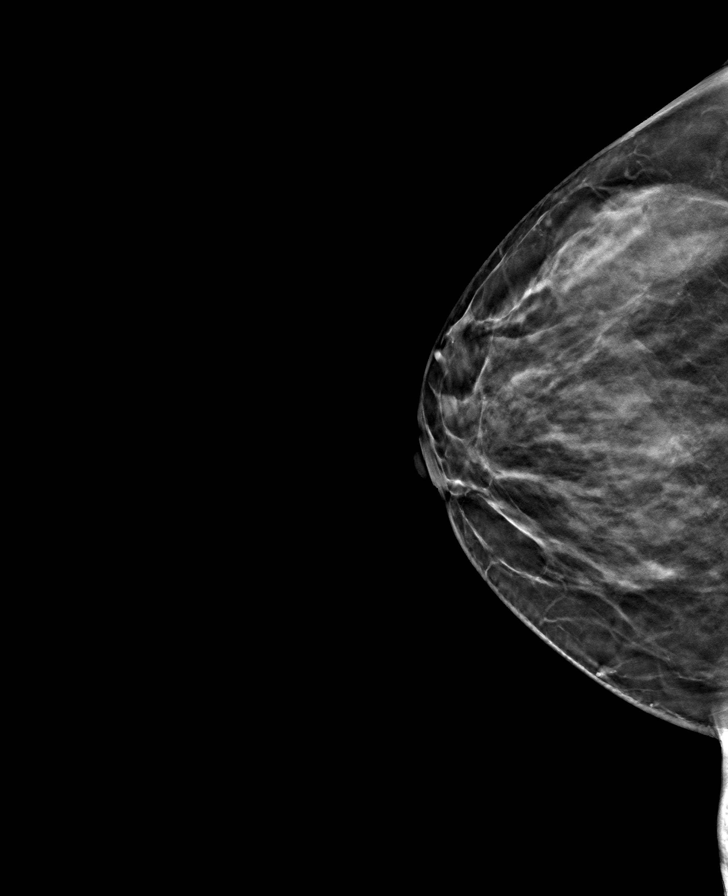

[R MLO tomo · tomo slice 31/62.0]
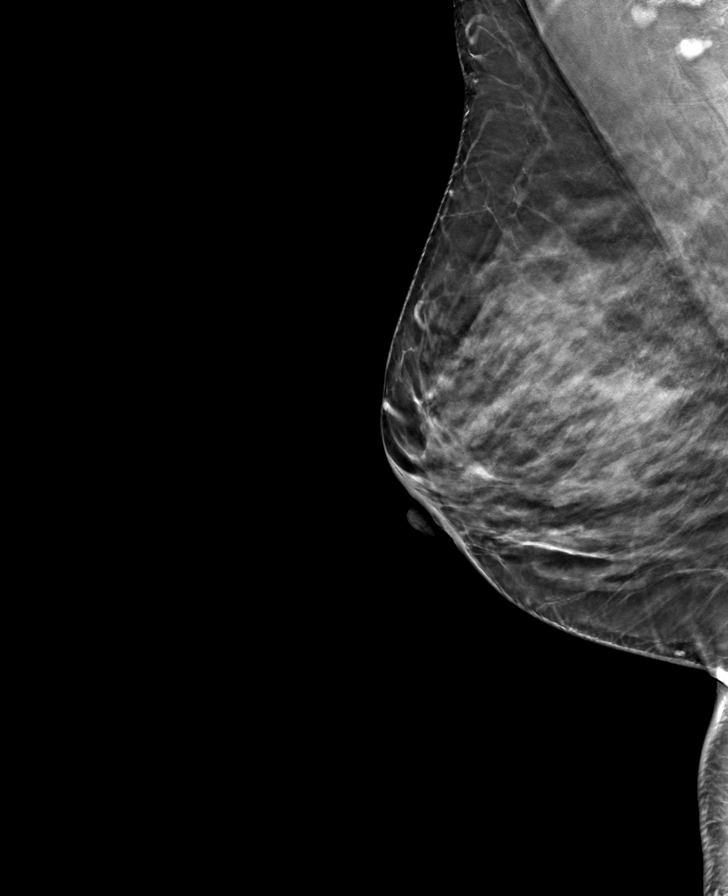

[L MLO tomo · tomo slice 31/62.0]
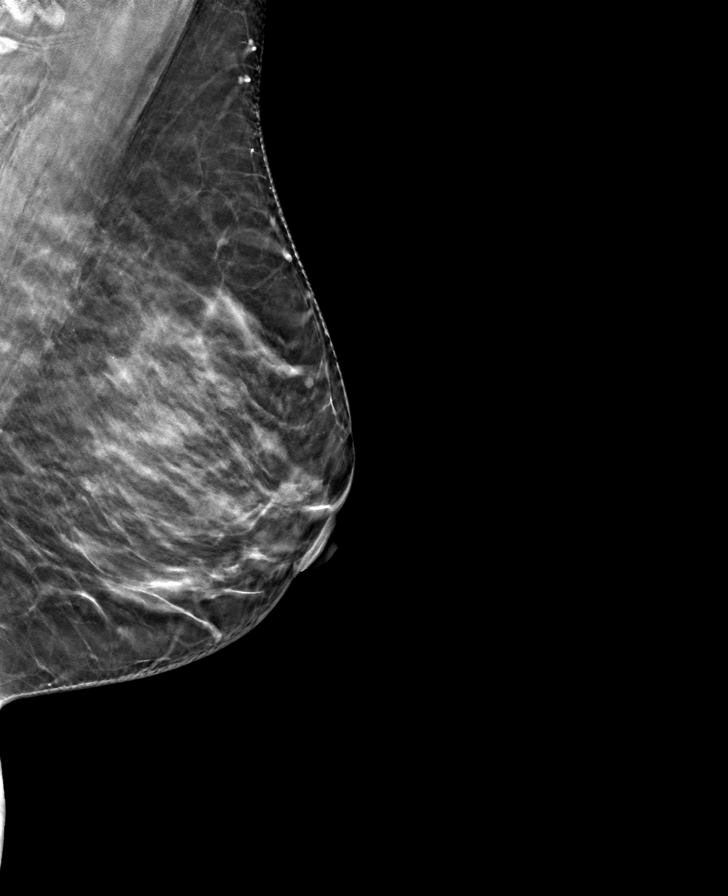

[8 of 24 positions shown; findings below may reference images not displayed]

ACR Breast Density Category d: The breast tissue is extremely dense,
which lowers the sensitivity of mammography
FINDINGS: There are no findings suspicious for malignancy.
IMPRESSION: No mammographic evidence of malignancy. A result letter of this
screening mammogram will be mailed directly to the patient.

RECOMMENDATION:
Screening mammogram in one year. (Code:TA-[id number])

BI-RADS CATEGORY  1: Negative.

## 2022-03-02 ENCOUNTER — Inpatient Hospital Stay: Payer: BC Managed Care – PPO

## 2022-03-02 ENCOUNTER — Inpatient Hospital Stay: Payer: BC Managed Care – PPO | Admitting: Hematology & Oncology

## 2022-03-09 ENCOUNTER — Inpatient Hospital Stay: Payer: BC Managed Care – PPO

## 2022-03-09 ENCOUNTER — Ambulatory Visit: Payer: BC Managed Care – PPO | Admitting: Hematology & Oncology

## 2022-03-12 ENCOUNTER — Ambulatory Visit: Payer: BC Managed Care – PPO | Admitting: Hematology & Oncology

## 2022-03-12 ENCOUNTER — Inpatient Hospital Stay: Payer: BC Managed Care – PPO

## 2022-03-30 ENCOUNTER — Other Ambulatory Visit: Payer: Self-pay | Admitting: *Deleted

## 2022-03-30 DIAGNOSIS — D473 Essential (hemorrhagic) thrombocythemia: Secondary | ICD-10-CM

## 2022-03-30 DIAGNOSIS — D500 Iron deficiency anemia secondary to blood loss (chronic): Secondary | ICD-10-CM

## 2022-03-30 DIAGNOSIS — D6804 Acquired von Willebrand disease: Secondary | ICD-10-CM

## 2022-03-30 MED ORDER — HYDROXYUREA 500 MG PO CAPS: 500.0000 mg | ORAL_CAPSULE | Freq: Two times a day (BID) | ORAL | 3 refills | Status: DC

## 2022-04-01 ENCOUNTER — Inpatient Hospital Stay (HOSPITAL_BASED_OUTPATIENT_CLINIC_OR_DEPARTMENT_OTHER): Payer: BC Managed Care – PPO | Admitting: Hematology & Oncology

## 2022-04-01 ENCOUNTER — Other Ambulatory Visit: Payer: Self-pay | Admitting: Oncology

## 2022-04-01 ENCOUNTER — Inpatient Hospital Stay: Payer: BC Managed Care – PPO | Attending: Hematology & Oncology

## 2022-04-01 ENCOUNTER — Encounter: Payer: Self-pay | Admitting: Hematology & Oncology

## 2022-04-01 VITALS — BP 151/80 | HR 66 | Temp 98.6°F | Resp 20 | Ht 69.0 in | Wt 205.8 lb

## 2022-04-01 DIAGNOSIS — D473 Essential (hemorrhagic) thrombocythemia: Secondary | ICD-10-CM | POA: Insufficient documentation

## 2022-04-01 DIAGNOSIS — K5090 Crohn's disease, unspecified, without complications: Secondary | ICD-10-CM | POA: Insufficient documentation

## 2022-04-01 DIAGNOSIS — D509 Iron deficiency anemia, unspecified: Secondary | ICD-10-CM | POA: Diagnosis not present

## 2022-04-01 DIAGNOSIS — D6804 Acquired von Willebrand disease: Secondary | ICD-10-CM | POA: Diagnosis not present

## 2022-04-01 LAB — CBC WITH DIFFERENTIAL (CANCER CENTER ONLY)
Abs Immature Granulocytes: 0.03 10*3/uL (ref 0.00–0.07)
Basophils Absolute: 0.1 10*3/uL (ref 0.0–0.1)
Basophils Relative: 1 %
Eosinophils Absolute: 0.1 10*3/uL (ref 0.0–0.5)
Eosinophils Relative: 1 %
HCT: 35.5 % — ABNORMAL LOW (ref 36.0–46.0)
Hemoglobin: 12.2 g/dL (ref 12.0–15.0)
Immature Granulocytes: 0 %
Lymphocytes Relative: 20 %
Lymphs Abs: 2.1 10*3/uL (ref 0.7–4.0)
MCH: 36.5 pg — ABNORMAL HIGH (ref 26.0–34.0)
MCHC: 34.4 g/dL (ref 30.0–36.0)
MCV: 106.3 fL — ABNORMAL HIGH (ref 80.0–100.0)
Monocytes Absolute: 0.8 10*3/uL (ref 0.1–1.0)
Monocytes Relative: 8 %
Neutro Abs: 7.5 10*3/uL (ref 1.7–7.7)
Neutrophils Relative %: 70 %
Platelet Count: 498 10*3/uL — ABNORMAL HIGH (ref 150–400)
RBC: 3.34 MIL/uL — ABNORMAL LOW (ref 3.87–5.11)
RDW: 13 % (ref 11.5–15.5)
WBC Count: 10.6 10*3/uL — ABNORMAL HIGH (ref 4.0–10.5)
nRBC: 0 % (ref 0.0–0.2)

## 2022-04-01 LAB — CMP (CANCER CENTER ONLY)
ALT: 16 U/L (ref 0–44)
AST: 16 U/L (ref 15–41)
Albumin: 4.3 g/dL (ref 3.5–5.0)
Alkaline Phosphatase: 45 U/L (ref 38–126)
Anion gap: 7 (ref 5–15)
BUN: 16 mg/dL (ref 6–20)
CO2: 30 mmol/L (ref 22–32)
Calcium: 10.2 mg/dL (ref 8.9–10.3)
Chloride: 102 mmol/L (ref 98–111)
Creatinine: 0.69 mg/dL (ref 0.44–1.00)
GFR, Estimated: >60 mL/min (ref >60)
Glucose, Bld: 95 mg/dL (ref 70–99)
Potassium: 3.2 mmol/L — ABNORMAL LOW (ref 3.5–5.1)
Sodium: 139 mmol/L (ref 135–145)
Total Bilirubin: 0.5 mg/dL (ref 0.3–1.2)
Total Protein: 6.9 g/dL (ref 6.5–8.1)

## 2022-04-01 LAB — SAVE SMEAR(SSMR), FOR PROVIDER SLIDE REVIEW

## 2022-04-01 LAB — RETICULOCYTES
Immature Retic Fract: 18.8 % — ABNORMAL HIGH (ref 2.3–15.9)
RBC.: 3.34 MIL/uL — ABNORMAL LOW (ref 3.87–5.11)
Retic Count, Absolute: 92.5 10*3/uL (ref 19.0–186.0)
Retic Ct Pct: 2.8 % (ref 0.4–3.1)

## 2022-04-01 LAB — LACTATE DEHYDROGENASE: LDH: 136 U/L (ref 98–192)

## 2022-04-01 LAB — FERRITIN: Ferritin: 37 ng/mL (ref 11–307)

## 2022-04-01 NOTE — Progress Notes (Signed)
Hematology and Oncology Follow Up Visit  CARLETA WOODROW [id number] [birth year] 56 y.o. 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 Bergman is here today for follow-up.  She is having little bit of a tough time.  Her mom apparently has had problems.  She had an insulin overdose.  She had complications from this, which is no surprise.  Ms. Warshawsky is trying to help with her mom to try to get her back on her feet.  She is doing okay with the Hydrea.  We have her on twice a day dosing.  She says that on occasion, she does not take the medicine twice a day.  She has had no flareups of the Crohn's disease.  There has been no hematochezia.  She has had no melena.  Currently, she is not on any medication for the Crohn's.  I think her doctor wants her to take Remicade which she has tried to hold off on this.  She has had no nausea or vomiting.  She has had no rashes.  Is been no leg swelling.  Overall, I would have to say that her performance status is ECOG 1.    Medications:  Allergies as of 04/01/2022       Reactions   Amlodipine Swelling   Irbesartan-hydrochlorothiazide Swelling   Valsartan-hydrochlorothiazide Other (See Comments)   Drowsy   Other Other (See Comments)   Olmesartan Medoxomil-hctz Cough        Medication List        Accurate as of April 01, 2022  3:53 PM. If you have any questions, ask your nurse or doctor.          acetaminophen 650 MG CR tablet Commonly known as: TYLENOL Take 650 mg by mouth every 8 (eight) hours.   budesonide 3 MG 24 hr capsule Commonly known as: ENTOCORT EC Take 9 mg by mouth daily.   Edarbyclor 40-25 MG Tabs Generic drug: Azilsartan-Chlorthalidone Take 1 tablet by mouth daily.    FLINTSTONES W/IRON PO Take 1 tablet by mouth daily.   Fusion Plus Caps Take 2 capsules by mouth daily. Take as directed: Pt to take 2 capsules by mouth daily. What changed:  how much to take when to take this additional instructions   hydroxyurea 500 MG capsule Commonly known as: HYDREA Take 1 capsule (500 mg total) by mouth 2 (two) times daily. May take with food to minimize GI side effects.   metoprolol tartrate 50 MG tablet Commonly known as: LOPRESSOR Take 50 mg by mouth 2 (two) times daily.   Potassium Chloride ER 20 MEQ Tbcr Take 1 tablet by mouth daily.   VITAMIN D PO Take by mouth daily.        Allergies:  Allergies  Allergen Reactions   Amlodipine Swelling   Irbesartan-Hydrochlorothiazide Swelling   Valsartan-Hydrochlorothiazide Other (See Comments)    Drowsy    Other Other (See Comments)   Olmesartan Medoxomil-Hctz Cough    Past Medical History, Surgical history, Social history, and Family History were reviewed and updated.  Review of Systems: Review of Systems  HENT: Negative.    Eyes: Negative.   Respiratory: Negative.    Cardiovascular: Negative.   Gastrointestinal: Negative.   Genitourinary: Negative.  Musculoskeletal: Negative.   Skin: Negative.   Neurological: Negative.   Endo/Heme/Allergies: Negative.   Psychiatric/Behavioral: Negative.       Physical Exam:  height is '5\' 9"'$  (1.753 m) and weight is 205 lb 12.8 oz (93.4 kg). Her oral temperature is 98.6 F (37 C). Her blood pressure is 151/80 (abnormal) and her pulse is 66. Her respiration is 20 and oxygen saturation is 100%.   Wt Readings from Last 3 Encounters:  04/01/22 205 lb 12.8 oz (93.4 kg)  12/12/21 204 lb 1.9 oz (92.6 kg)  10/07/21 201 lb 12.8 oz (91.5 kg)   Physical Exam Vitals reviewed.  HENT:     Head: Normocephalic and atraumatic.  Eyes:     Pupils: Pupils are equal, round, and reactive to light.  Cardiovascular:     Rate and Rhythm: Normal rate and regular  rhythm.     Heart sounds: Normal heart sounds.  Pulmonary:     Effort: Pulmonary effort is normal.     Breath sounds: Normal breath sounds.  Abdominal:     General: Bowel sounds are normal.     Palpations: Abdomen is soft.  Musculoskeletal:        General: No tenderness or deformity. Normal range of motion.     Cervical back: Normal range of motion.  Lymphadenopathy:     Cervical: No cervical adenopathy.  Skin:    General: Skin is warm and dry.     Findings: No erythema or rash.  Neurological:     Mental Status: She is alert and oriented to person, place, and time.  Psychiatric:        Behavior: Behavior normal.        Thought Content: Thought content normal.        Judgment: Judgment normal.     Lab Results  Component Value Date   WBC 10.6 (H) 04/01/2022   HGB 12.2 04/01/2022   HCT 35.5 (L) 04/01/2022   MCV 106.3 (H) 04/01/2022   PLT 498 (H) 04/01/2022   Lab Results  Component Value Date   FERRITIN 12 12/12/2021   IRON 26 (L) 12/12/2021   TIBC 350 12/12/2021   UIBC 324 12/12/2021   IRONPCTSAT 7 (L) 12/12/2021   Lab Results  Component Value Date   RETICCTPCT 2.8 04/01/2022   RBC 3.34 (L) 04/01/2022   RBC 3.34 (L) 04/01/2022   No results found for: "KPAFRELGTCHN", "LAMBDASER", "KAPLAMBRATIO" No results found for: "IGGSERUM", "IGA", "IGMSERUM" No results found for: "TOTALPROTELP", "ALBUMINELP", "A1GS", "A2GS", "BETS", "BETA2SER", "GAMS", "MSPIKE", "SPEI"   Chemistry      Component Value Date/Time   NA 139 12/12/2021 1508   K 3.0 (L) 12/12/2021 1508   CL 101 12/12/2021 1508   CO2 30 12/12/2021 1508   BUN 13 12/12/2021 1508   CREATININE 0.79 12/12/2021 1508      Component Value Date/Time   CALCIUM 9.8 12/12/2021 1508   ALKPHOS 45 12/12/2021 1508   AST 14 (L) 12/12/2021 1508   ALT 14 12/12/2021 1508   BILITOT 0.3 12/12/2021 1508       Impression and Plan: Ms. Satterly is a pleasant 56 yo African American with essential thrombocythemia.  Her  thrombocythemia is JAK2 positive.  Her platelet count is clearly improving.  The Hydrea dose is what we want right now.  I realize that she does have other priorities.  I know she is trying to help her mom out.  However, I also know that she is doing a great job with the  Hydrea.  However she is taking right now is adequate because her platelet count is starting to come down.  We will plan to get her back in about 3 months.  We will try to get her through the rest of Summer.  I know that she is really focused on trying to help her mom.  She is a good woman and I am not surprised that her priority is her mom.    [name], MD 6/14/20233:53 PM

## 2022-04-02 LAB — IRON AND IRON BINDING CAPACITY (CC-WL,HP ONLY)
Iron: 101 ug/dL (ref 28–170)
Saturation Ratios: 34 % — ABNORMAL HIGH (ref 10.4–31.8)
TIBC: 298 ug/dL (ref 250–450)
UIBC: 197 ug/dL (ref 148–442)

## 2022-04-03 ENCOUNTER — Telehealth: Payer: Self-pay

## 2022-04-03 LAB — VON WILLEBRAND PANEL
Coagulation Factor VIII: 79 % (ref 56–140)
Ristocetin Co-factor, Plasma: 28 % — ABNORMAL LOW (ref 50–200)
Von Willebrand Antigen, Plasma: 82 % (ref 50–200)

## 2022-04-03 LAB — COAG STUDIES INTERP REPORT

## 2022-04-03 NOTE — Telephone Encounter (Signed)
-----   Message from [name], MD sent at 04/01/2022  5:26 PM EDT ----- Please call her let her know that her chemistry labs look okay.  Potassium is little bit low at 3.2.  Her liver tests all look fine.  Her calcium is 10.2.  Her kidneys are working well.  She can take some extra potassium in her food.  She can use bananas.  She can use raisins.  [name]

## 2022-04-06 ENCOUNTER — Encounter: Payer: Self-pay | Admitting: Family

## 2022-04-06 NOTE — Telephone Encounter (Signed)
Called and informed patient of lab results, patient verbalized understanding and denies any questions or concerns at this time.   

## 2022-05-03 ENCOUNTER — Emergency Department (HOSPITAL_BASED_OUTPATIENT_CLINIC_OR_DEPARTMENT_OTHER): Payer: BC Managed Care – PPO

## 2022-05-03 ENCOUNTER — Encounter (HOSPITAL_BASED_OUTPATIENT_CLINIC_OR_DEPARTMENT_OTHER): Payer: Self-pay | Admitting: Emergency Medicine

## 2022-05-03 ENCOUNTER — Emergency Department (HOSPITAL_BASED_OUTPATIENT_CLINIC_OR_DEPARTMENT_OTHER)
Admission: EM | Admit: 2022-05-03 | Discharge: 2022-05-03 | Disposition: A | Discharge status: Home / Self Care | Payer: BC Managed Care – PPO | Attending: Emergency Medicine | Admitting: Emergency Medicine

## 2022-05-03 ENCOUNTER — Other Ambulatory Visit: Payer: Self-pay

## 2022-05-03 DIAGNOSIS — Z79899 Other long term (current) drug therapy: Secondary | ICD-10-CM | POA: Insufficient documentation

## 2022-05-03 DIAGNOSIS — I10 Essential (primary) hypertension: Secondary | ICD-10-CM | POA: Insufficient documentation

## 2022-05-03 DIAGNOSIS — R079 Chest pain, unspecified: Secondary | ICD-10-CM | POA: Diagnosis present

## 2022-05-03 LAB — CBC
HCT: 39.1 % (ref 36.0–46.0)
Hemoglobin: 13.5 g/dL (ref 12.0–15.0)
MCH: 36.7 pg — ABNORMAL HIGH (ref 26.0–34.0)
MCHC: 34.5 g/dL (ref 30.0–36.0)
MCV: 106.3 fL — ABNORMAL HIGH (ref 80.0–100.0)
Platelets: 645 10*3/uL — ABNORMAL HIGH (ref 150–400)
RBC: 3.68 MIL/uL — ABNORMAL LOW (ref 3.87–5.11)
RDW: 12.5 % (ref 11.5–15.5)
WBC: 10.4 10*3/uL (ref 4.0–10.5)
nRBC: 0 % (ref 0.0–0.2)

## 2022-05-03 LAB — BASIC METABOLIC PANEL
Anion gap: 8 (ref 5–15)
BUN: 11 mg/dL (ref 6–20)
CO2: 27 mmol/L (ref 22–32)
Calcium: 10.3 mg/dL (ref 8.9–10.3)
Chloride: 104 mmol/L (ref 98–111)
Creatinine, Ser: 0.71 mg/dL (ref 0.44–1.00)
GFR, Estimated: >60 mL/min (ref >60)
Glucose, Bld: 123 mg/dL — ABNORMAL HIGH (ref 70–99)
Potassium: 3.1 mmol/L — ABNORMAL LOW (ref 3.5–5.1)
Sodium: 139 mmol/L (ref 135–145)

## 2022-05-03 LAB — TROPONIN I (HIGH SENSITIVITY): Troponin I (High Sensitivity): 4 ng/L (ref <18)

## 2022-05-03 LAB — PREGNANCY, URINE: Preg Test, Ur: NEGATIVE

## 2022-05-03 NOTE — ED Provider Notes (Signed)
[facility] Provider Note   CSN: [id number] Arrival date & time: 05/03/22  1616     History Chief Complaint  Patient presents with   Chest Pain    Amy Johnson is a 56 y.o. 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  This started 2 days ago.  Patient states that her blood pressures have been elevated since Friday.  She has been taking it once per day with her at home cuff and it was elevated between 825 systolic follow-up to 053 systolic.  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  She takes metoprolol and Edarbyclor for blood pressure.  Has not missed any doses.  She endorses associated lightheadedness and headache.  She is currently asymptomatic with however.    Chest Pain      Home Medications Prior to Admission medications   Medication Sig Start Date End Date Taking? Authorizing Provider  acetaminophen (TYLENOL) 650 MG CR tablet Take 650 mg by mouth every 8 (eight) hours.    [provider]  budesonide (ENTOCORT EC) 3 MG 24 hr capsule Take 9 mg by mouth daily. 09/28/20   [provider]  EDARBYCLOR 40-25 MG TABS Take 1 tablet by mouth daily. 05/03/20   [provider]  hydroxyurea (HYDREA) 500 MG capsule Take 1 capsule (500 mg total) by mouth 2 (two) times daily. May take with food to minimize GI side effects. 03/30/22   [name], MD  Iron-FA-B Cmp-C-Biot-Probiotic (FUSION PLUS) CAPS Take 2 capsules by mouth daily. Take as directed: Pt to take 2 capsules by mouth daily. Patient taking differently: Take 1 capsule by mouth every other day. 08/07/21   [name], MD  metoprolol tartrate (LOPRESSOR) 50 MG tablet Take 50 mg by mouth 2 (two) times daily. 05/03/20   [provider]  Pediatric Multivitamins-Iron  Vicki Mallet W/IRON PO) Take 1 tablet by mouth daily. 05/24/20   [provider]  Potassium Chloride ER 20 MEQ TBCR Take 1 tablet by mouth daily. 01/31/20   [provider]  VITAMIN D PO Take by mouth daily.    [provider]      Allergies    Amlodipine, Irbesartan-hydrochlorothiazide, Valsartan-hydrochlorothiazide, Other, and Olmesartan medoxomil-hctz    Review of Systems   Review of Systems  Cardiovascular:  Positive for chest pain.  All other systems reviewed and are negative.   Physical Exam Updated Vital Signs BP (!) 152/70   Pulse 67   Temp 98.8 F (37.1 C) (Oral)   Resp 13   Ht '5\' 9"'$  (1.753 m)   Wt 90.7 kg   SpO2 99%   BMI 29.53 kg/m  Physical Exam Vitals and nursing note reviewed.  Constitutional:      General: She is not in acute distress.    Appearance: Normal appearance.  HENT:     Head: Normocephalic and atraumatic.  Eyes:     General:        Right eye: No discharge.        Left eye: No discharge.  Cardiovascular:     Comments: Regular rate and rhythm.  S1/S2 are distinct without any evidence of murmur, rubs, or gallops.  Radial pulses are 2+ bilaterally.  Dorsalis pedis pulses are 2+ bilaterally.  No evidence of pedal edema. Pulmonary:  Comments: Clear to auscultation bilaterally.  Normal effort.  No respiratory distress.  No evidence of wheezes, rales, or rhonchi heard throughout. Abdominal:     General: Abdomen is flat. Bowel sounds are normal. There is no distension.     Tenderness: There is no abdominal tenderness. There is no guarding or rebound.  Musculoskeletal:        General: Normal range of motion.     Cervical back: Neck supple.  Skin:    General: Skin is warm and dry.     Findings: No rash.  Neurological:     General: No focal deficit present.     Mental Status: She is alert.  Psychiatric:        Mood and Affect: Mood normal.        Behavior: Behavior normal.     ED Results / Procedures / Treatments    Labs (all labs ordered are listed, but only abnormal results are displayed) Labs Reviewed  BASIC METABOLIC PANEL - Abnormal; Notable for the following components:      Result Value   Potassium 3.1 (*)    Glucose, Bld 123 (*)    All other components within normal limits  CBC - Abnormal; Notable for the following components:   RBC 3.68 (*)    MCV 106.3 (*)    MCH 36.7 (*)    Platelets 645 (*)    All other components within normal limits  PREGNANCY, URINE  TROPONIN I (HIGH SENSITIVITY)    EKG EKG Interpretation  Date/Time:  Sunday May 03 2022 16:30:32 EDT Ventricular Rate:  79 PR Interval:  158 QRS Duration: 88 QT Interval:  384 QTC Calculation: 440 R Axis:   -3 Text Interpretation: Normal sinus rhythm Possible Left atrial enlargement Left ventricular hypertrophy ( R in aVL , Cornell product ) Abnormal ECG No previous ECGs available No old tracing to compare Confirmed by Lacretia Leigh ([id number]) on 05/04/2022 2:03:07 PM  Radiology No results found.  Procedures Procedures    Medications Ordered in ED Medications - No data to display  ED Course/ Medical Decision Making/ A&P                           Medical Decision Making Amount and/or Complexity of Data Reviewed Labs: ordered. Radiology: ordered.   Patient presented to the emergency department today with chest pain that radiates into the jaw in addition to elevated blood pressures.  Troponin was negative.  Labs were all reassuring.  I suspect this is likely just asymptomatic hypertension.  We will have her follow-up with her primary care doctor.  Patient amenable this plan.  She is here for discharge.  Final Clinical Impression(s) / ED Diagnoses Final diagnoses:  Chest pain, unspecified type  Hypertension, unspecified type    Rx / DC Orders ED Discharge Orders     None         Hendricks Limes, PA-C 05/21/22 1231    Fredia Sorrow, MD 05/21/22 2326

## 2022-05-03 NOTE — ED Triage Notes (Signed)
Pt arrives pov, steady gait, endorses feeling light headed x 3 days pta. Pt also reports hypertension x 3 days and central CP and jaw pain x 2 days pta. Denies CP at this time. Pt also endorses bilateral ankle swelling, denies shob

## 2022-05-03 NOTE — Discharge Instructions (Addendum)
Please follow-up with your hematologist and primary care doctor for medication adjustments.  Please return to the emergency department for any worsening symptoms you might have.

## 2022-07-03 ENCOUNTER — Inpatient Hospital Stay: Payer: BC Managed Care – PPO | Attending: Hematology & Oncology

## 2022-07-03 ENCOUNTER — Encounter: Payer: Self-pay | Admitting: Hematology & Oncology

## 2022-07-03 ENCOUNTER — Other Ambulatory Visit: Payer: Self-pay

## 2022-07-03 ENCOUNTER — Inpatient Hospital Stay: Payer: BC Managed Care – PPO | Admitting: Hematology & Oncology

## 2022-07-03 VITALS — BP 147/73 | HR 68 | Temp 98.7°F | Resp 16 | Ht 69.0 in | Wt 209.0 lb

## 2022-07-03 DIAGNOSIS — D6804 Acquired von Willebrand disease: Secondary | ICD-10-CM | POA: Diagnosis not present

## 2022-07-03 DIAGNOSIS — D75839 Thrombocytosis, unspecified: Secondary | ICD-10-CM | POA: Diagnosis not present

## 2022-07-03 DIAGNOSIS — K5090 Crohn's disease, unspecified, without complications: Secondary | ICD-10-CM | POA: Diagnosis not present

## 2022-07-03 DIAGNOSIS — Z79899 Other long term (current) drug therapy: Secondary | ICD-10-CM | POA: Insufficient documentation

## 2022-07-03 DIAGNOSIS — D473 Essential (hemorrhagic) thrombocythemia: Secondary | ICD-10-CM

## 2022-07-03 DIAGNOSIS — D509 Iron deficiency anemia, unspecified: Secondary | ICD-10-CM | POA: Diagnosis not present

## 2022-07-03 DIAGNOSIS — D500 Iron deficiency anemia secondary to blood loss (chronic): Secondary | ICD-10-CM

## 2022-07-03 LAB — CBC WITH DIFFERENTIAL (CANCER CENTER ONLY)
Abs Immature Granulocytes: 0.02 10*3/uL (ref 0.00–0.07)
Basophils Absolute: 0.1 10*3/uL (ref 0.0–0.1)
Basophils Relative: 1 %
Eosinophils Absolute: 0.1 10*3/uL (ref 0.0–0.5)
Eosinophils Relative: 1 %
HCT: 35.1 % — ABNORMAL LOW (ref 36.0–46.0)
Hemoglobin: 12.1 g/dL (ref 12.0–15.0)
Immature Granulocytes: 0 %
Lymphocytes Relative: 26 %
Lymphs Abs: 2.6 10*3/uL (ref 0.7–4.0)
MCH: 37.3 pg — ABNORMAL HIGH (ref 26.0–34.0)
MCHC: 34.5 g/dL (ref 30.0–36.0)
MCV: 108.3 fL — ABNORMAL HIGH (ref 80.0–100.0)
Monocytes Absolute: 0.9 10*3/uL (ref 0.1–1.0)
Monocytes Relative: 9 %
Neutro Abs: 6.6 10*3/uL (ref 1.7–7.7)
Neutrophils Relative %: 63 %
Platelet Count: 460 10*3/uL — ABNORMAL HIGH (ref 150–400)
RBC: 3.24 MIL/uL — ABNORMAL LOW (ref 3.87–5.11)
RDW: 12.9 % (ref 11.5–15.5)
WBC Count: 10.3 10*3/uL (ref 4.0–10.5)
nRBC: 0 % (ref 0.0–0.2)

## 2022-07-03 LAB — CMP (CANCER CENTER ONLY)
ALT: 16 U/L (ref 0–44)
AST: 15 U/L (ref 15–41)
Albumin: 4.4 g/dL (ref 3.5–5.0)
Alkaline Phosphatase: 52 U/L (ref 38–126)
Anion gap: 6 (ref 5–15)
BUN: 15 mg/dL (ref 6–20)
CO2: 32 mmol/L (ref 22–32)
Calcium: 10 mg/dL (ref 8.9–10.3)
Chloride: 100 mmol/L (ref 98–111)
Creatinine: 0.8 mg/dL (ref 0.44–1.00)
GFR, Estimated: >60 mL/min (ref >60)
Glucose, Bld: 114 mg/dL — ABNORMAL HIGH (ref 70–99)
Potassium: 3.5 mmol/L (ref 3.5–5.1)
Sodium: 138 mmol/L (ref 135–145)
Total Bilirubin: 0.3 mg/dL (ref 0.3–1.2)
Total Protein: 7.7 g/dL (ref 6.5–8.1)

## 2022-07-03 LAB — FERRITIN: Ferritin: 76 ng/mL (ref 11–307)

## 2022-07-03 LAB — LACTATE DEHYDROGENASE: LDH: 121 U/L (ref 98–192)

## 2022-07-03 LAB — SAVE SMEAR(SSMR), FOR PROVIDER SLIDE REVIEW

## 2022-07-03 NOTE — Progress Notes (Signed)
Hematology and Oncology Follow Up Visit  Amy Johnson [id number] 08/11/1966 56 y.o. 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  She is doing okay.  She was having some problems with blood pressure.  She does have some problems with potassium.  Overall, I think these are pretty much taken care of right now.  Her blood pressure was okay when we saw her today.  Her potassium was not all that bad.  She is having some problems with the right side of her neck.  I think she may have a muscle strain.  I told her she can try some Tylenol or nonsteroidal.  She could also use some over-the-counter muscle cream.  She has had no problems with bleeding.  There is been no problems with flareups of her Crohn's disease.  She is on the Hydrea.  She is doing well on the Hydrea, really without any side effects.  Her last iron studies back in June showed a ferritin of 37 with an iron saturation of 34%.  She is eating well.  She is having no problems with nausea or vomiting.  She did have little bit of swelling in the legs but this seems to be better.  Overall, I would say that her performance status is probably ECOG 0.   Medications:  Allergies as of 07/03/2022       Reactions   Amlodipine Swelling   Irbesartan-hydrochlorothiazide Swelling   Valsartan-hydrochlorothiazide Other (See Comments)   Drowsy   Other Other (See Comments)   Olmesartan Medoxomil-hctz Cough        Medication List        Accurate as of July 03, 2022  4:45 PM. If you have any questions, ask your nurse or doctor.          acetaminophen 650 MG CR tablet Commonly known as: TYLENOL Take 650 mg by mouth every 8 (eight)  hours.   budesonide 3 MG 24 hr capsule Commonly known as: ENTOCORT EC Take 9 mg by mouth daily.   Edarbyclor 40-25 MG Tabs Generic drug: Azilsartan-Chlorthalidone Take 1 tablet by mouth daily.   FLINTSTONES W/IRON PO Take 1 tablet by mouth daily.   Fusion Plus Caps Take 2 capsules by mouth daily. Take as directed: Pt to take 2 capsules by mouth daily. What changed:  how much to take when to take this additional instructions   hydroxyurea 500 MG capsule Commonly known as: HYDREA Take 1 capsule (500 mg total) by mouth 2 (two) times daily. May take with food to minimize GI side effects.   metoprolol tartrate 50 MG tablet Commonly known as: LOPRESSOR Take 50 mg by mouth 2 (two) times daily.   Potassium Chloride ER 20 MEQ Tbcr Take 1 tablet by mouth daily.   VITAMIN D PO Take by mouth daily.        Allergies:  Allergies  Allergen Reactions   Amlodipine Swelling   Irbesartan-Hydrochlorothiazide Swelling   Valsartan-Hydrochlorothiazide Other (See Comments)    Drowsy    Other Other (See Comments)   Olmesartan Medoxomil-Hctz Cough    Past Medical History, Surgical history, Social history, and Family History were reviewed and updated.  Review of Systems:  Review of Systems  HENT: Negative.    Eyes: Negative.   Respiratory: Negative.    Cardiovascular: Negative.   Gastrointestinal: Negative.   Genitourinary: Negative.   Musculoskeletal: Negative.   Skin: Negative.   Neurological: Negative.   Endo/Heme/Allergies: Negative.   Psychiatric/Behavioral: Negative.       Physical Exam:  height is '5\' 9"'$  (1.753 m) and weight is 209 lb (94.8 kg). Her oral temperature is 98.7 F (37.1 C). Her blood pressure is 147/73 (abnormal) and her pulse is 68. Her respiration is 16 and oxygen saturation is 100%.   Wt Readings from Last 3 Encounters:  07/03/22 209 lb (94.8 kg)  05/03/22 200 lb (90.7 kg)  04/01/22 205 lb 12.8 oz (93.4 kg)   Physical Exam Vitals reviewed.   HENT:     Head: Normocephalic and atraumatic.  Eyes:     Pupils: Pupils are equal, round, and reactive to light.  Cardiovascular:     Rate and Rhythm: Normal rate and regular rhythm.     Heart sounds: Normal heart sounds.  Pulmonary:     Effort: Pulmonary effort is normal.     Breath sounds: Normal breath sounds.  Abdominal:     General: Bowel sounds are normal.     Palpations: Abdomen is soft.  Musculoskeletal:        General: No tenderness or deformity. Normal range of motion.     Cervical back: Normal range of motion.  Lymphadenopathy:     Cervical: No cervical adenopathy.  Skin:    General: Skin is warm and dry.     Findings: No erythema or rash.  Neurological:     Mental Status: She is alert and oriented to person, place, and time.  Psychiatric:        Behavior: Behavior normal.        Thought Content: Thought content normal.        Judgment: Judgment normal.      Lab Results  Component Value Date   WBC 10.3 07/03/2022   HGB 12.1 07/03/2022   HCT 35.1 (L) 07/03/2022   MCV 108.3 (H) 07/03/2022   PLT 460 (H) 07/03/2022   Lab Results  Component Value Date   FERRITIN 37 04/01/2022   IRON 101 04/01/2022   TIBC 298 04/01/2022   UIBC 197 04/01/2022   IRONPCTSAT 34 (H) 04/01/2022   Lab Results  Component Value Date   RETICCTPCT 2.8 04/01/2022   RBC 3.24 (L) 07/03/2022   No results found for: "KPAFRELGTCHN", "LAMBDASER", "KAPLAMBRATIO" No results found for: "IGGSERUM", "IGA", "IGMSERUM" No results found for: "TOTALPROTELP", "ALBUMINELP", "A1GS", "A2GS", "BETS", "BETA2SER", "GAMS", "MSPIKE", "SPEI"   Chemistry      Component Value Date/Time   NA 138 07/03/2022 1513   K 3.5 07/03/2022 1513   CL 100 07/03/2022 1513   CO2 32 07/03/2022 1513   BUN 15 07/03/2022 1513   CREATININE 0.80 07/03/2022 1513      Component Value Date/Time   CALCIUM 10.0 07/03/2022 1513   ALKPHOS 52 07/03/2022 1513   AST 15 07/03/2022 1513   ALT 16 07/03/2022 1513   BILITOT 0.3  07/03/2022 1513       Impression and Plan: Amy Johnson is a pleasant 56 yo African American with essential thrombocythemia.  Her thrombocythemia is JAK2 positive.  Her platelet count is continuing to improve.  As such, I would not make a change to her Hydrea dose.  I think all of her other issues can be sorted out by her family  doctor.  I think that we can probably get her through the [place] season now.  I feel pretty good about her platelet count continuing to trend downward.  I would like to see her back in January.     [name], MD 9/15/20234:45 PM

## 2022-07-06 ENCOUNTER — Telehealth: Payer: Self-pay | Admitting: *Deleted

## 2022-07-06 LAB — IRON AND IRON BINDING CAPACITY (CC-WL,HP ONLY)
Iron: 55 ug/dL (ref 28–170)
Saturation Ratios: 19 % (ref 10.4–31.8)
TIBC: 295 ug/dL (ref 250–450)
UIBC: 240 ug/dL (ref 148–442)

## 2022-07-06 NOTE — Telephone Encounter (Signed)
-----   Message from [name], MD sent at 07/06/2022  1:19 PM EDT ----- Call and let her know that the iron level is on the lower side of normal.  I think were still okay not had to give her any IV iron.

## 2022-07-06 NOTE — Telephone Encounter (Signed)
As noted below by Dr. Marin Olp, I tried calling patient to inform her that the iron level is on the lower side of normal at 55. Dr. Marin Olp is OK with that since we have not given her any IV iron. Her cell phone mailbox is full and I was unable to leave a message.

## 2022-07-12 ENCOUNTER — Other Ambulatory Visit: Payer: Self-pay | Admitting: Hematology & Oncology

## 2022-07-13 ENCOUNTER — Encounter: Payer: Self-pay | Admitting: Family

## 2022-07-15 ENCOUNTER — Other Ambulatory Visit: Payer: Self-pay | Admitting: Family Medicine

## 2022-07-15 DIAGNOSIS — Z1231 Encounter for screening mammogram for malignant neoplasm of breast: Secondary | ICD-10-CM

## 2022-08-12 ENCOUNTER — Ambulatory Visit
Admission: RE | Admit: 2022-08-12 | Discharge: 2022-08-12 | Disposition: A | Discharge status: Home / Self Care | Payer: BC Managed Care – PPO | Source: Ambulatory Visit | Attending: Family Medicine | Admitting: Family Medicine

## 2022-08-12 DIAGNOSIS — Z1231 Encounter for screening mammogram for malignant neoplasm of breast: Secondary | ICD-10-CM

## 2022-08-20 ENCOUNTER — Ambulatory Visit: Payer: BC Managed Care – PPO

## 2022-08-28 ENCOUNTER — Ambulatory Visit: Payer: BC Managed Care – PPO

## 2022-09-22 ENCOUNTER — Other Ambulatory Visit: Payer: Self-pay | Admitting: Hematology & Oncology

## 2022-09-22 DIAGNOSIS — D6804 Acquired von Willebrand disease: Secondary | ICD-10-CM

## 2022-09-22 DIAGNOSIS — D500 Iron deficiency anemia secondary to blood loss (chronic): Secondary | ICD-10-CM

## 2022-09-22 DIAGNOSIS — D473 Essential (hemorrhagic) thrombocythemia: Secondary | ICD-10-CM

## 2022-09-23 ENCOUNTER — Encounter: Payer: Self-pay | Admitting: Family

## 2022-09-23 ENCOUNTER — Other Ambulatory Visit: Payer: Self-pay

## 2022-09-23 ENCOUNTER — Other Ambulatory Visit (HOSPITAL_COMMUNITY): Payer: Self-pay

## 2022-09-23 DIAGNOSIS — D500 Iron deficiency anemia secondary to blood loss (chronic): Secondary | ICD-10-CM

## 2022-09-23 DIAGNOSIS — D6804 Acquired von Willebrand disease: Secondary | ICD-10-CM

## 2022-09-23 DIAGNOSIS — D473 Essential (hemorrhagic) thrombocythemia: Secondary | ICD-10-CM

## 2022-09-23 MED ORDER — HYDROXYUREA 500 MG PO CAPS
500.0000 mg | ORAL_CAPSULE | Freq: Two times a day (BID) | ORAL | 3 refills | Status: DC
  Filled 2022-09-23: qty 60, 30d supply, fill #0

## 2022-10-20 ENCOUNTER — Ambulatory Visit: Payer: BC Managed Care – PPO

## 2022-10-23 ENCOUNTER — Ambulatory Visit
Admission: RE | Admit: 2022-10-23 | Discharge: 2022-10-23 | Disposition: A | Discharge status: Home / Self Care | Payer: BC Managed Care – PPO | Source: Ambulatory Visit | Attending: Family Medicine | Admitting: Family Medicine

## 2022-10-23 DIAGNOSIS — Z1231 Encounter for screening mammogram for malignant neoplasm of breast: Secondary | ICD-10-CM

## 2022-10-28 ENCOUNTER — Inpatient Hospital Stay: Payer: BC Managed Care – PPO | Admitting: Hematology & Oncology

## 2022-10-28 ENCOUNTER — Inpatient Hospital Stay: Payer: BC Managed Care – PPO | Attending: Hematology & Oncology

## 2022-10-28 ENCOUNTER — Encounter: Payer: Self-pay | Admitting: Hematology & Oncology

## 2022-10-28 VITALS — BP 132/68 | HR 76 | Temp 98.8°F | Resp 17 | Wt 205.0 lb

## 2022-10-28 DIAGNOSIS — D509 Iron deficiency anemia, unspecified: Secondary | ICD-10-CM | POA: Insufficient documentation

## 2022-10-28 DIAGNOSIS — K5090 Crohn's disease, unspecified, without complications: Secondary | ICD-10-CM | POA: Insufficient documentation

## 2022-10-28 DIAGNOSIS — D6804 Acquired von Willebrand disease: Secondary | ICD-10-CM | POA: Diagnosis not present

## 2022-10-28 DIAGNOSIS — D473 Essential (hemorrhagic) thrombocythemia: Secondary | ICD-10-CM | POA: Diagnosis present

## 2022-10-28 DIAGNOSIS — D500 Iron deficiency anemia secondary to blood loss (chronic): Secondary | ICD-10-CM

## 2022-10-28 LAB — CBC WITH DIFFERENTIAL (CANCER CENTER ONLY)
Abs Immature Granulocytes: 0.06 10*3/uL (ref 0.00–0.07)
Basophils Absolute: 0.1 10*3/uL (ref 0.0–0.1)
Basophils Relative: 0 %
Eosinophils Absolute: 0.1 10*3/uL (ref 0.0–0.5)
Eosinophils Relative: 1 %
HCT: 38.8 % (ref 36.0–46.0)
Hemoglobin: 13 g/dL (ref 12.0–15.0)
Immature Granulocytes: 0 %
Lymphocytes Relative: 13 %
Lymphs Abs: 2.2 10*3/uL (ref 0.7–4.0)
MCH: 34.8 pg — ABNORMAL HIGH (ref 26.0–34.0)
MCHC: 33.5 g/dL (ref 30.0–36.0)
MCV: 103.7 fL — ABNORMAL HIGH (ref 80.0–100.0)
Monocytes Absolute: 1.3 10*3/uL — ABNORMAL HIGH (ref 0.1–1.0)
Monocytes Relative: 8 %
Neutro Abs: 12.9 10*3/uL — ABNORMAL HIGH (ref 1.7–7.7)
Neutrophils Relative %: 78 %
Platelet Count: 764 10*3/uL — ABNORMAL HIGH (ref 150–400)
RBC: 3.74 MIL/uL — ABNORMAL LOW (ref 3.87–5.11)
RDW: 13.1 % (ref 11.5–15.5)
WBC Count: 16.6 10*3/uL — ABNORMAL HIGH (ref 4.0–10.5)
nRBC: 0 % (ref 0.0–0.2)

## 2022-10-28 LAB — LACTATE DEHYDROGENASE: LDH: 158 U/L (ref 98–192)

## 2022-10-28 LAB — CMP (CANCER CENTER ONLY)
ALT: 16 U/L (ref 0–44)
AST: 16 U/L (ref 15–41)
Albumin: 4.6 g/dL (ref 3.5–5.0)
Alkaline Phosphatase: 59 U/L (ref 38–126)
Anion gap: 8 (ref 5–15)
BUN: 16 mg/dL (ref 6–20)
CO2: 32 mmol/L (ref 22–32)
Calcium: 10.9 mg/dL — ABNORMAL HIGH (ref 8.9–10.3)
Chloride: 101 mmol/L (ref 98–111)
Creatinine: 0.82 mg/dL (ref 0.44–1.00)
GFR, Estimated: >60 mL/min (ref >60)
Glucose, Bld: 127 mg/dL — ABNORMAL HIGH (ref 70–99)
Potassium: 3.7 mmol/L (ref 3.5–5.1)
Sodium: 141 mmol/L (ref 135–145)
Total Bilirubin: 0.4 mg/dL (ref 0.3–1.2)
Total Protein: 8.1 g/dL (ref 6.5–8.1)

## 2022-10-28 LAB — FERRITIN: Ferritin: 63 ng/mL (ref 11–307)

## 2022-10-28 NOTE — Progress Notes (Signed)
Hematology and Oncology Follow Up Visit  Amy Johnson [id number] 1966/03/08 57 y.o. 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  We last saw her back in September.  Since then, she has been doing pretty well.  Unfortunate, she has not been taking the Hydrea.  When she takes it, she is only taking 1 a day.  This probably makes sense as to why her platelet count is still high now.  I told her to get to get back on the [facility] but take it twice a day.  She has been doing twice a day for about a year.  She had a very nice response with her platelets coming down nicely.  She is still working.  She is trying to help take care of her mom.  She has a 71 year old son in high school.  I would say that her "plate is full".    Of note, we last saw her, her ferritin was 76 and iron saturation of 19%.    There has been no problems with nausea or vomiting.  She has had no change in bowel or bladder habits.  She has had her monthly cycles.    I do not think there is been any flareups of her Crohn's disease.  Overall, I would say performance status is probably ECOG 0.     Medications:  Allergies as of 10/28/2022       Reactions   Amlodipine Swelling   Irbesartan-hydrochlorothiazide Swelling   Valsartan-hydrochlorothiazide Other (See Comments)   Drowsy   Other Other (See Comments)   Olmesartan Medoxomil-hctz Cough        Medication List        Accurate as of October 28, 2022  4:09 PM. If you have any questions, ask your nurse or doctor.          acetaminophen 650 MG CR tablet Commonly known as: TYLENOL Take 650 mg by mouth every 8 (eight) hours.   budesonide 3 MG 24 hr capsule Commonly known  as: ENTOCORT EC Take 9 mg by mouth daily.   Edarbyclor 40-25 MG Tabs Generic drug: Azilsartan-Chlorthalidone Take 1 tablet by mouth daily.   FLINTSTONES W/IRON PO Take 1 tablet by mouth daily.   Fusion Plus Caps TAKE 2 CAPSULES BY MOUTH DAILY AS DIRECTED   hydroxyurea 500 MG capsule Commonly known as: HYDREA Take 1 capsule (500 mg total) by mouth 2 (two) times daily. May take with food to minimize GI side effects.   metoprolol tartrate 50 MG tablet Commonly known as: LOPRESSOR Take 50 mg by mouth 2 (two) times daily.   Potassium Chloride ER 20 MEQ Tbcr Take 1 tablet by mouth daily.   spironolactone 25 MG tablet Commonly known as: ALDACTONE Take 12.5 mg by mouth daily.   VITAMIN D PO Take by mouth daily.        Allergies:  Allergies  Allergen Reactions   Amlodipine Swelling   Irbesartan-Hydrochlorothiazide Swelling   Valsartan-Hydrochlorothiazide Other (See Comments)    Drowsy    Other Other (See Comments)   Olmesartan Medoxomil-Hctz Cough    Past Medical History, Surgical history, Social history, and Family History were reviewed and updated.  Review  of Systems: Review of Systems  HENT: Negative.    Eyes: Negative.   Respiratory: Negative.    Cardiovascular: Negative.   Gastrointestinal: Negative.   Genitourinary: Negative.   Musculoskeletal: Negative.   Skin: Negative.   Neurological: Negative.   Endo/Heme/Allergies: Negative.   Psychiatric/Behavioral: Negative.       Physical Exam:  weight is 205 lb (93 kg). Her oral temperature is 98.8 F (37.1 C). Her blood pressure is 132/68 and her pulse is 76. Her respiration is 17 and oxygen saturation is 99%.   Wt Readings from Last 3 Encounters:  10/28/22 205 lb (93 kg)  07/03/22 209 lb (94.8 kg)  05/03/22 200 lb (90.7 kg)   Physical Exam Vitals reviewed.  HENT:     Head: Normocephalic and atraumatic.  Eyes:     Pupils: Pupils are equal, round, and reactive to light.  Cardiovascular:     Rate  and Rhythm: Normal rate and regular rhythm.     Heart sounds: Normal heart sounds.  Pulmonary:     Effort: Pulmonary effort is normal.     Breath sounds: Normal breath sounds.  Abdominal:     General: Bowel sounds are normal.     Palpations: Abdomen is soft.  Musculoskeletal:        General: No tenderness or deformity. Normal range of motion.     Cervical back: Normal range of motion.  Lymphadenopathy:     Cervical: No cervical adenopathy.  Skin:    General: Skin is warm and dry.     Findings: No erythema or rash.  Neurological:     Mental Status: She is alert and oriented to person, place, and time.  Psychiatric:        Behavior: Behavior normal.        Thought Content: Thought content normal.        Judgment: Judgment normal.      Lab Results  Component Value Date   WBC 16.6 (H) 10/28/2022   HGB 13.0 10/28/2022   HCT 38.8 10/28/2022   MCV 103.7 (H) 10/28/2022   PLT 764 (H) 10/28/2022   Lab Results  Component Value Date   FERRITIN 76 07/03/2022   IRON 55 07/03/2022   TIBC 295 07/03/2022   UIBC 240 07/03/2022   IRONPCTSAT 19 07/03/2022   Lab Results  Component Value Date   RETICCTPCT 2.8 04/01/2022   RBC 3.74 (L) 10/28/2022   No results found for: "KPAFRELGTCHN", "LAMBDASER", "KAPLAMBRATIO" No results found for: "IGGSERUM", "IGA", "IGMSERUM" No results found for: "TOTALPROTELP", "ALBUMINELP", "A1GS", "A2GS", "BETS", "BETA2SER", "GAMS", "MSPIKE", "SPEI"   Chemistry      Component Value Date/Time   NA 141 10/28/2022 1459   K 3.7 10/28/2022 1459   CL 101 10/28/2022 1459   CO2 32 10/28/2022 1459   BUN 16 10/28/2022 1459   CREATININE 0.82 10/28/2022 1459      Component Value Date/Time   CALCIUM 10.9 (H) 10/28/2022 1459   ALKPHOS 59 10/28/2022 1459   AST 16 10/28/2022 1459   ALT 16 10/28/2022 1459   BILITOT 0.4 10/28/2022 1459       Impression and Plan: Amy Johnson is a pleasant 57 yo African American with essential thrombocythemia.  Her  thrombocythemia is JAK2 positive.  Again, the platelet count is high because she has not been taking the Hydrea as scheduled.  She will now take it at twice a day.  I know this will work.  We will see what her iron studies look like also.  I went to have to get her back probably in about 2 months or so just to make sure we follow-up with her platelets.   [name], MD 1/10/20244:09 PM

## 2022-10-29 LAB — IRON AND IRON BINDING CAPACITY (CC-WL,HP ONLY)
Iron: 35 ug/dL (ref 28–170)
Saturation Ratios: 11 % (ref 10.4–31.8)
TIBC: 311 ug/dL (ref 250–450)
UIBC: 276 ug/dL (ref 148–442)

## 2022-11-03 ENCOUNTER — Telehealth: Payer: Self-pay

## 2022-11-03 MED ORDER — FUSION PLUS PO CAPS: ORAL_CAPSULE | ORAL | 2 refills | Status: DC

## 2022-11-03 NOTE — Telephone Encounter (Signed)
-----  Message from [name], MD sent at 10/29/2022 12:55 PM EST ----- Call and let him know that the iron is on the very low side.  She does have iron.  She to take it intravenously or orally.  Find out what she wants to do.  If she wants oral iron we can send in Fusion Plus.  Please let me know.  Thanks.  [name]

## 2022-11-03 NOTE — Telephone Encounter (Signed)
Called and informed patient of lab results, patient verbalized understanding and denies any questions or concerns at this time. Pt would like prescription sent to [facility] on [address]. [place] sent.

## 2022-12-30 ENCOUNTER — Inpatient Hospital Stay: Payer: BC Managed Care – PPO | Admitting: Hematology & Oncology

## 2022-12-30 ENCOUNTER — Inpatient Hospital Stay: Payer: BC Managed Care – PPO | Attending: Hematology & Oncology

## 2022-12-30 ENCOUNTER — Encounter: Payer: Self-pay | Admitting: Hematology & Oncology

## 2022-12-30 ENCOUNTER — Other Ambulatory Visit: Payer: Self-pay

## 2022-12-30 VITALS — BP 118/64 | HR 73 | Temp 98.6°F | Resp 18 | Ht 69.0 in | Wt 207.0 lb

## 2022-12-30 DIAGNOSIS — D509 Iron deficiency anemia, unspecified: Secondary | ICD-10-CM | POA: Diagnosis not present

## 2022-12-30 DIAGNOSIS — D473 Essential (hemorrhagic) thrombocythemia: Secondary | ICD-10-CM | POA: Insufficient documentation

## 2022-12-30 LAB — CMP (CANCER CENTER ONLY)
ALT: 18 U/L (ref 0–44)
AST: 17 U/L (ref 15–41)
Albumin: 4.5 g/dL (ref 3.5–5.0)
Alkaline Phosphatase: 53 U/L (ref 38–126)
Anion gap: 8 (ref 5–15)
BUN: 18 mg/dL (ref 6–20)
CO2: 33 mmol/L — ABNORMAL HIGH (ref 22–32)
Calcium: 10.5 mg/dL — ABNORMAL HIGH (ref 8.9–10.3)
Chloride: 99 mmol/L (ref 98–111)
Creatinine: 0.89 mg/dL (ref 0.44–1.00)
GFR, Estimated: >60 mL/min (ref >60)
Glucose, Bld: 103 mg/dL — ABNORMAL HIGH (ref 70–99)
Potassium: 3.5 mmol/L (ref 3.5–5.1)
Sodium: 140 mmol/L (ref 135–145)
Total Bilirubin: 0.5 mg/dL (ref 0.3–1.2)
Total Protein: 7.7 g/dL (ref 6.5–8.1)

## 2022-12-30 LAB — CBC WITH DIFFERENTIAL (CANCER CENTER ONLY)
Abs Immature Granulocytes: 0.03 10*3/uL (ref 0.00–0.07)
Basophils Absolute: 0.1 10*3/uL (ref 0.0–0.1)
Basophils Relative: 1 %
Eosinophils Absolute: 0.1 10*3/uL (ref 0.0–0.5)
Eosinophils Relative: 1 %
HCT: 36.5 % (ref 36.0–46.0)
Hemoglobin: 12.3 g/dL (ref 12.0–15.0)
Immature Granulocytes: 0 %
Lymphocytes Relative: 23 %
Lymphs Abs: 2.8 10*3/uL (ref 0.7–4.0)
MCH: 35.7 pg — ABNORMAL HIGH (ref 26.0–34.0)
MCHC: 33.7 g/dL (ref 30.0–36.0)
MCV: 105.8 fL — ABNORMAL HIGH (ref 80.0–100.0)
Monocytes Absolute: 1 10*3/uL (ref 0.1–1.0)
Monocytes Relative: 8 %
Neutro Abs: 8.2 10*3/uL — ABNORMAL HIGH (ref 1.7–7.7)
Neutrophils Relative %: 67 %
Platelet Count: 600 10*3/uL — ABNORMAL HIGH (ref 150–400)
RBC: 3.45 MIL/uL — ABNORMAL LOW (ref 3.87–5.11)
RDW: 14.4 % (ref 11.5–15.5)
WBC Count: 12.1 10*3/uL — ABNORMAL HIGH (ref 4.0–10.5)
nRBC: 0 % (ref 0.0–0.2)

## 2022-12-30 LAB — FERRITIN: Ferritin: 94 ng/mL (ref 11–307)

## 2022-12-30 LAB — LACTATE DEHYDROGENASE: LDH: 143 U/L (ref 98–192)

## 2022-12-30 NOTE — Progress Notes (Signed)
Hematology and Oncology Follow Up Visit  Amy Johnson [id number] 02/21/1966 57 y.o. 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  She is doing okay.  She is taking the Hydrea twice a day when she remembers.  Sometimes she does not remember today but for the most part, I think she is doing a great job.  Over last saw her back in January, her iron saturation was only 11%.  She is taking some oral iron I think.  She has had no problems with her Crohn's disease.  She has had no flareups.  She has had no problems with pain in the hands or feet.  She has had no rashes.  There is been no bleeding.  There is been no change in bowel or bladder habits.  She has had no nausea or vomiting.  Has been no cough or shortness of breath.  Thankfully, she is avoided COVID.  Overall, I would say that her performance status is ECOG 0.   Medications:  Allergies as of 12/30/2022       Reactions   Amlodipine Swelling   Irbesartan-hydrochlorothiazide Swelling   Valsartan-hydrochlorothiazide Other (See Comments)   Drowsy   Other Other (See Comments)   Olmesartan Medoxomil-hctz Cough        Medication List        Accurate as of December 30, 2022  4:04 PM. If you have any questions, ask your nurse or doctor.          acetaminophen 650 MG CR tablet Commonly known as: TYLENOL Take 650 mg by mouth every 8 (eight) hours.   budesonide 3 MG 24 hr capsule Commonly known as: ENTOCORT EC Take 9 mg by mouth daily.   Edarbyclor 40-25 MG Tabs Generic drug: Azilsartan-Chlorthalidone Take 1 tablet by mouth daily.   FLINTSTONES W/IRON PO Take 1 tablet by mouth daily.   Fusion Plus Caps TAKE 2 CAPSULES BY MOUTH DAILY  AS DIRECTED   Fusion Plus Caps Take 1 capsule by mouth daily as directed.   hydroxyurea 500 MG capsule Commonly known as: HYDREA Take 1 capsule (500 mg total) by mouth 2 (two) times daily. May take with food to minimize GI side effects.   metoprolol tartrate 50 MG tablet Commonly known as: LOPRESSOR Take 50 mg by mouth 2 (two) times daily.   Potassium Chloride ER 20 MEQ Tbcr Take 1 tablet by mouth daily.   spironolactone 25 MG tablet Commonly known as: ALDACTONE Take 12.5 mg by mouth daily.   VITAMIN D PO Take by mouth daily.        Allergies:  Allergies  Allergen Reactions   Amlodipine Swelling   Irbesartan-Hydrochlorothiazide Swelling   Valsartan-Hydrochlorothiazide Other (See Comments)    Drowsy    Other Other (See Comments)   Olmesartan Medoxomil-Hctz Cough    Past Medical History, Surgical history, Social history, and Family History were reviewed and updated.  Review of Systems: Review of Systems  HENT: Negative.    Eyes: Negative.   Respiratory: Negative.    Cardiovascular: Negative.   Gastrointestinal: Negative.   Genitourinary: Negative.   Musculoskeletal: Negative.   Skin: Negative.   Neurological: Negative.   Endo/Heme/Allergies: Negative.  Psychiatric/Behavioral: Negative.       Physical Exam:  height is '5\' 9"'$  (1.753 m) and weight is 207 lb (93.9 kg). Her oral temperature is 98.6 F (37 C). Her blood pressure is 118/64 and her pulse is 73. Her respiration is 18 and oxygen saturation is 99%.   Wt Readings from Last 3 Encounters:  12/30/22 207 lb (93.9 kg)  10/28/22 205 lb (93 kg)  07/03/22 209 lb (94.8 kg)   Physical Exam Vitals reviewed.  HENT:     Head: Normocephalic and atraumatic.  Eyes:     Pupils: Pupils are equal, round, and reactive to light.  Cardiovascular:     Rate and Rhythm: Normal rate and regular rhythm.     Heart sounds: Normal heart sounds.  Pulmonary:     Effort: Pulmonary effort is normal.     Breath sounds:  Normal breath sounds.  Abdominal:     General: Bowel sounds are normal.     Palpations: Abdomen is soft.  Musculoskeletal:        General: No tenderness or deformity. Normal range of motion.     Cervical back: Normal range of motion.  Lymphadenopathy:     Cervical: No cervical adenopathy.  Skin:    General: Skin is warm and dry.     Findings: No erythema or rash.  Neurological:     Mental Status: She is alert and oriented to person, place, and time.  Psychiatric:        Behavior: Behavior normal.        Thought Content: Thought content normal.        Judgment: Judgment normal.     Lab Results  Component Value Date   WBC 12.1 (H) 12/30/2022   HGB 12.3 12/30/2022   HCT 36.5 12/30/2022   MCV 105.8 (H) 12/30/2022   PLT 600 (H) 12/30/2022   Lab Results  Component Value Date   FERRITIN 63 10/28/2022   IRON 35 10/28/2022   TIBC 311 10/28/2022   UIBC 276 10/28/2022   IRONPCTSAT 11 10/28/2022   Lab Results  Component Value Date   RETICCTPCT 2.8 04/01/2022   RBC 3.45 (L) 12/30/2022   No results found for: "KPAFRELGTCHN", "LAMBDASER", "KAPLAMBRATIO" No results found for: "IGGSERUM", "IGA", "IGMSERUM" No results found for: "TOTALPROTELP", "ALBUMINELP", "A1GS", "A2GS", "BETS", "BETA2SER", "GAMS", "MSPIKE", "SPEI"   Chemistry      Component Value Date/Time   NA 140 12/30/2022 1458   K 3.5 12/30/2022 1458   CL 99 12/30/2022 1458   CO2 33 (H) 12/30/2022 1458   BUN 18 12/30/2022 1458   CREATININE 0.89 12/30/2022 1458      Component Value Date/Time   CALCIUM 10.5 (H) 12/30/2022 1458   ALKPHOS 53 12/30/2022 1458   AST 17 12/30/2022 1458   ALT 18 12/30/2022 1458   BILITOT 0.5 12/30/2022 1458       Impression and Plan: Amy Johnson is a pleasant 57 yo African American with essential thrombocythemia.  Her thrombocythemia is JAK2 positive.  I am glad to see that her platelet count is coming down.  I do think that the twice a day Hydrea is helping.  Again, she does not  take it twice a day all the time.  So hopefully she will be a little bit more diligent with doing this.  I would like to see her back in 2 more months.  We will see what her iron levels look like.  If we need to, we can always give her IV  iron and this probably will help bring her platelet count even lower.    [name], MD 3/13/20244:04 PM

## 2022-12-31 ENCOUNTER — Telehealth: Payer: Self-pay

## 2022-12-31 LAB — IRON AND IRON BINDING CAPACITY (CC-WL,HP ONLY)
Iron: 65 ug/dL (ref 28–170)
Saturation Ratios: 22 % (ref 10.4–31.8)
TIBC: 297 ug/dL (ref 250–450)
UIBC: 232 ug/dL (ref 148–442)

## 2022-12-31 NOTE — Telephone Encounter (Signed)
Called and informed patient of lab results, patient verbalized understanding and denies any questions or concerns at this time.   

## 2022-12-31 NOTE — Telephone Encounter (Signed)
-----   Message from [name], MD sent at 12/31/2022 12:13 PM EDT ----- Call and let her know that the iron level is doing okay.  She must keep taking the iron by mouth.  This is helping her platelets.  Thanks.  [name]

## 2023-03-09 ENCOUNTER — Other Ambulatory Visit: Payer: Self-pay | Admitting: Hematology & Oncology

## 2023-03-09 DIAGNOSIS — D6804 Acquired von Willebrand disease: Secondary | ICD-10-CM

## 2023-03-09 DIAGNOSIS — D473 Essential (hemorrhagic) thrombocythemia: Secondary | ICD-10-CM

## 2023-03-09 DIAGNOSIS — D500 Iron deficiency anemia secondary to blood loss (chronic): Secondary | ICD-10-CM

## 2023-03-11 ENCOUNTER — Other Ambulatory Visit: Payer: Self-pay

## 2023-03-11 ENCOUNTER — Inpatient Hospital Stay: Payer: BC Managed Care – PPO | Attending: Hematology & Oncology

## 2023-03-11 ENCOUNTER — Inpatient Hospital Stay: Payer: BC Managed Care – PPO | Admitting: Hematology & Oncology

## 2023-03-11 ENCOUNTER — Encounter: Payer: Self-pay | Admitting: Hematology & Oncology

## 2023-03-11 VITALS — BP 120/59 | HR 72 | Temp 99.0°F | Resp 18 | Ht 69.0 in | Wt 200.0 lb

## 2023-03-11 DIAGNOSIS — K5090 Crohn's disease, unspecified, without complications: Secondary | ICD-10-CM | POA: Insufficient documentation

## 2023-03-11 DIAGNOSIS — D6804 Acquired von Willebrand disease: Secondary | ICD-10-CM | POA: Insufficient documentation

## 2023-03-11 DIAGNOSIS — D75839 Thrombocytosis, unspecified: Secondary | ICD-10-CM | POA: Insufficient documentation

## 2023-03-11 DIAGNOSIS — D509 Iron deficiency anemia, unspecified: Secondary | ICD-10-CM | POA: Insufficient documentation

## 2023-03-11 DIAGNOSIS — D473 Essential (hemorrhagic) thrombocythemia: Secondary | ICD-10-CM

## 2023-03-11 LAB — CBC WITH DIFFERENTIAL (CANCER CENTER ONLY)
Abs Immature Granulocytes: 0.07 10*3/uL (ref 0.00–0.07)
Basophils Absolute: 0.1 10*3/uL (ref 0.0–0.1)
Basophils Relative: 1 %
Eosinophils Absolute: 0.1 10*3/uL (ref 0.0–0.5)
Eosinophils Relative: 1 %
HCT: 36.7 % (ref 36.0–46.0)
Hemoglobin: 12.7 g/dL (ref 12.0–15.0)
Immature Granulocytes: 1 %
Lymphocytes Relative: 23 %
Lymphs Abs: 2.7 10*3/uL (ref 0.7–4.0)
MCH: 37.6 pg — ABNORMAL HIGH (ref 26.0–34.0)
MCHC: 34.6 g/dL (ref 30.0–36.0)
MCV: 108.6 fL — ABNORMAL HIGH (ref 80.0–100.0)
Monocytes Absolute: 1 10*3/uL (ref 0.1–1.0)
Monocytes Relative: 8 %
Neutro Abs: 7.8 10*3/uL — ABNORMAL HIGH (ref 1.7–7.7)
Neutrophils Relative %: 66 %
Platelet Count: 539 10*3/uL — ABNORMAL HIGH (ref 150–400)
RBC: 3.38 MIL/uL — ABNORMAL LOW (ref 3.87–5.11)
RDW: 13.2 % (ref 11.5–15.5)
WBC Count: 11.7 10*3/uL — ABNORMAL HIGH (ref 4.0–10.5)
nRBC: 0 % (ref 0.0–0.2)

## 2023-03-11 LAB — CMP (CANCER CENTER ONLY)
ALT: 19 U/L (ref 0–44)
AST: 16 U/L (ref 15–41)
Albumin: 4.6 g/dL (ref 3.5–5.0)
Alkaline Phosphatase: 53 U/L (ref 38–126)
Anion gap: 7 (ref 5–15)
BUN: 16 mg/dL (ref 6–20)
CO2: 31 mmol/L (ref 22–32)
Calcium: 10.7 mg/dL — ABNORMAL HIGH (ref 8.9–10.3)
Chloride: 100 mmol/L (ref 98–111)
Creatinine: 0.81 mg/dL (ref 0.44–1.00)
GFR, Estimated: >60 mL/min (ref >60)
Glucose, Bld: 131 mg/dL — ABNORMAL HIGH (ref 70–99)
Potassium: 3.4 mmol/L — ABNORMAL LOW (ref 3.5–5.1)
Sodium: 138 mmol/L (ref 135–145)
Total Bilirubin: 0.4 mg/dL (ref 0.3–1.2)
Total Protein: 7.4 g/dL (ref 6.5–8.1)

## 2023-03-11 LAB — LACTATE DEHYDROGENASE: LDH: 134 U/L (ref 98–192)

## 2023-03-11 LAB — SAVE SMEAR(SSMR), FOR PROVIDER SLIDE REVIEW

## 2023-03-11 LAB — FERRITIN: Ferritin: 104 ng/mL (ref 11–307)

## 2023-03-11 NOTE — Progress Notes (Signed)
Hematology and Oncology Follow Up Visit  Amy Johnson [id number] 04-Aug-1966 57 y.o. 03/11/2023   Principle Diagnosis:  Essential thrombocythemia - JAK2 positive Acquired Von Willebrand Crohn's Disease Iron deficiency anemia   Current Therapy:        Hydrea 500 mg PO daily - changed to 500 mg PO BID 10/10/2021 IV iron as indicated Folic acid - OTC   Interim History:  Amy Johnson is here today for follow-up.  She is doing okay.  She is doing better with the Hydrea.  She is taking more often now.  I think this finally shows because Amy platelet count started to come down.  She has had no problems with the Crohn's disease.  She is on oral steroids for this.  When we last saw Amy, Amy ferritin was 94 with an iron saturation of 22%.  She has had no problems with bleeding.  She has had no nausea or vomiting.  She has had no cough or shortness of breath.  She has had no rashes.  There is been no leg swelling.  It sounds like she will have a quiet Memorial Day weekend.  Amy Johnson lives with Amy who she has to take care of.  Overall, I would say that Amy performance status is probably ECOG 1.  Medications:  Allergies as of 03/11/2023       Reactions   Amlodipine Swelling   Irbesartan-hydrochlorothiazide Swelling   Valsartan-hydrochlorothiazide Other (See Comments)   Drowsy   Other Other (See Comments)   Olmesartan Medoxomil-hctz Cough        Medication List        Accurate as of Mar 11, 2023  5:20 PM. If you have any questions, ask your nurse or doctor.          acetaminophen 650 MG CR tablet Commonly known as: TYLENOL Take 650 mg by mouth every 8 (eight) hours.   budesonide 3 MG 24 hr capsule Commonly known as: ENTOCORT EC Take 9 mg by mouth daily.   Edarbyclor 40-25 MG Tabs Generic drug: Azilsartan-Chlorthalidone Take 1 tablet by mouth daily.   FLINTSTONES W/IRON PO Take 1 tablet by mouth daily.   Fusion Plus Caps Take 1 capsule by mouth daily as  directed. What changed: Another medication with the same name was removed. Continue taking this medication, and follow the directions you see here. Changed by: Josph Macho, MD   hydroxyurea 500 MG capsule Commonly known as: HYDREA TAKE 1 CAPSULE(500 MG) BY MOUTH TWICE DAILY. MAY TAKE WITH FOOD TO MINIMIZE GI SIDE EFFECTS   metoprolol tartrate 50 MG tablet Commonly known as: LOPRESSOR Take 50 mg by mouth 2 (two) times daily.   Potassium Chloride ER 20 MEQ Tbcr Take 1 tablet by mouth daily.   spironolactone 25 MG tablet Commonly known as: ALDACTONE Take 12.5 mg by mouth daily.   VITAMIN D PO Take by mouth daily.        Allergies:  Allergies  Allergen Reactions   Amlodipine Swelling   Irbesartan-Hydrochlorothiazide Swelling   Valsartan-Hydrochlorothiazide Other (See Comments)    Drowsy    Other Other (See Comments)   Olmesartan Medoxomil-Hctz Cough    Past Medical History, Surgical history, Social history, and Family History were reviewed and updated.  Review of Systems: Review of Systems  HENT: Negative.    Eyes: Negative.   Respiratory: Negative.    Cardiovascular: Negative.   Gastrointestinal: Negative.   Genitourinary: Negative.   Musculoskeletal: Negative.   Skin: Negative.  Neurological: Negative.   Endo/Heme/Allergies: Negative.   Psychiatric/Behavioral: Negative.       Physical Exam:  height is 5\' 9"  (1.753 m) and weight is 200 lb (90.7 kg). Amy oral temperature is 99 F (37.2 C). Amy blood pressure is 120/59 (abnormal) and Amy pulse is 72. Amy respiration is 18 and oxygen saturation is 100%.   Wt Readings from Last 3 Encounters:  03/11/23 200 lb (90.7 kg)  12/30/22 207 lb (93.9 kg)  10/28/22 205 lb (93 kg)   Physical Exam Vitals reviewed.  HENT:     Head: Normocephalic and atraumatic.  Eyes:     Pupils: Pupils are equal, round, and reactive to light.  Cardiovascular:     Rate and Rhythm: Normal rate and regular rhythm.     Heart  sounds: Normal heart sounds.  Pulmonary:     Effort: Pulmonary effort is normal.     Breath sounds: Normal breath sounds.  Abdominal:     General: Bowel sounds are normal.     Palpations: Abdomen is soft.  Musculoskeletal:        General: No tenderness or deformity. Normal range of motion.     Cervical back: Normal range of motion.  Lymphadenopathy:     Cervical: No cervical adenopathy.  Skin:    General: Skin is warm and dry.     Findings: No erythema or rash.  Neurological:     Mental Status: She is alert and oriented to person, place, and time.  Psychiatric:        Behavior: Behavior normal.        Thought Content: Thought content normal.        Judgment: Judgment normal.      Lab Results  Component Value Date   WBC 11.7 (H) 03/11/2023   HGB 12.7 03/11/2023   HCT 36.7 03/11/2023   MCV 108.6 (H) 03/11/2023   PLT 539 (H) 03/11/2023   Lab Results  Component Value Date   FERRITIN 94 12/30/2022   IRON 65 12/30/2022   TIBC 297 12/30/2022   UIBC 232 12/30/2022   IRONPCTSAT 22 12/30/2022   Lab Results  Component Value Date   RETICCTPCT 2.8 04/01/2022   RBC 3.38 (L) 03/11/2023   No results found for: "KPAFRELGTCHN", "LAMBDASER", "KAPLAMBRATIO" No results found for: "IGGSERUM", "IGA", "IGMSERUM" No results found for: "TOTALPROTELP", "ALBUMINELP", "A1GS", "A2GS", "BETS", "BETA2SER", "GAMS", "MSPIKE", "SPEI"   Chemistry      Component Value Date/Time   NA 138 03/11/2023 1514   K 3.4 (L) 03/11/2023 1514   CL 100 03/11/2023 1514   CO2 31 03/11/2023 1514   BUN 16 03/11/2023 1514   CREATININE 0.81 03/11/2023 1514      Component Value Date/Time   CALCIUM 10.7 (H) 03/11/2023 1514   ALKPHOS 53 03/11/2023 1514   AST 16 03/11/2023 1514   ALT 19 03/11/2023 1514   BILITOT 0.4 03/11/2023 1514       Impression and Plan: Amy Johnson is a pleasant 57 yo African American with essential thrombocythemia.  Amy thrombocythemia is JAK2 positive.  I am glad to see that Amy  platelet count is still coming down.  I do think that the twice a day Hydrea is helping.  I would not change the dose.  Again, she has been more diligent with taking the Hydrea.  I think we can probably get Amy through the Summer now.  I would like to see Amy back after Labor Day.      Josph Macho,  MD 5/23/20245:20 PM

## 2023-03-12 ENCOUNTER — Telehealth: Payer: Self-pay | Admitting: *Deleted

## 2023-03-12 LAB — IRON AND IRON BINDING CAPACITY (CC-WL,HP ONLY)
Iron: 40 ug/dL (ref 28–170)
Saturation Ratios: 14 % (ref 10.4–31.8)
TIBC: 288 ug/dL (ref 250–450)
UIBC: 248 ug/dL (ref 148–442)

## 2023-03-12 NOTE — Telephone Encounter (Signed)
Unable to leave message for pt as phone /VM is full.

## 2023-03-12 NOTE — Telephone Encounter (Signed)
Spoke to pt, gave iron result and MD instructions to continue OTC iron. Pt verbalized understanding

## 2023-03-12 NOTE — Telephone Encounter (Signed)
-----   Message from Josph Macho, MD sent at 03/12/2023 11:03 AM EDT ----- Call and let her know that the iron level is on the lower side.  She can continue to take oral iron.  Thanks.  Cindee Lame

## 2023-08-18 ENCOUNTER — Other Ambulatory Visit: Payer: Self-pay | Admitting: Family Medicine

## 2023-08-18 DIAGNOSIS — Z1231 Encounter for screening mammogram for malignant neoplasm of breast: Secondary | ICD-10-CM

## 2023-08-19 ENCOUNTER — Other Ambulatory Visit: Payer: Self-pay | Admitting: Hematology & Oncology

## 2023-08-19 DIAGNOSIS — D500 Iron deficiency anemia secondary to blood loss (chronic): Secondary | ICD-10-CM

## 2023-08-19 DIAGNOSIS — D473 Essential (hemorrhagic) thrombocythemia: Secondary | ICD-10-CM

## 2023-08-19 DIAGNOSIS — D6804 Acquired von Willebrand disease: Secondary | ICD-10-CM

## 2023-08-30 ENCOUNTER — Other Ambulatory Visit: Payer: Self-pay

## 2023-08-30 ENCOUNTER — Inpatient Hospital Stay: Payer: BC Managed Care – PPO | Attending: Hematology & Oncology

## 2023-08-30 ENCOUNTER — Inpatient Hospital Stay (HOSPITAL_BASED_OUTPATIENT_CLINIC_OR_DEPARTMENT_OTHER): Payer: BC Managed Care – PPO | Admitting: Medical Oncology

## 2023-08-30 ENCOUNTER — Encounter: Payer: Self-pay | Admitting: Medical Oncology

## 2023-08-30 ENCOUNTER — Other Ambulatory Visit (HOSPITAL_BASED_OUTPATIENT_CLINIC_OR_DEPARTMENT_OTHER): Payer: Self-pay

## 2023-08-30 DIAGNOSIS — D6804 Acquired von Willebrand disease: Secondary | ICD-10-CM

## 2023-08-30 DIAGNOSIS — D473 Essential (hemorrhagic) thrombocythemia: Secondary | ICD-10-CM | POA: Diagnosis not present

## 2023-08-30 DIAGNOSIS — D500 Iron deficiency anemia secondary to blood loss (chronic): Secondary | ICD-10-CM

## 2023-08-30 DIAGNOSIS — K5090 Crohn's disease, unspecified, without complications: Secondary | ICD-10-CM | POA: Insufficient documentation

## 2023-08-30 LAB — CBC WITH DIFFERENTIAL (CANCER CENTER ONLY)
Abs Immature Granulocytes: 0.03 10*3/uL (ref 0.00–0.07)
Basophils Absolute: 0.1 10*3/uL (ref 0.0–0.1)
Basophils Relative: 1 %
Eosinophils Absolute: 0.1 10*3/uL (ref 0.0–0.5)
Eosinophils Relative: 1 %
HCT: 36.1 % (ref 36.0–46.0)
Hemoglobin: 12.5 g/dL (ref 12.0–15.0)
Immature Granulocytes: 0 %
Lymphocytes Relative: 18 %
Lymphs Abs: 1.9 10*3/uL (ref 0.7–4.0)
MCH: 37.8 pg — ABNORMAL HIGH (ref 26.0–34.0)
MCHC: 34.6 g/dL (ref 30.0–36.0)
MCV: 109.1 fL — ABNORMAL HIGH (ref 80.0–100.0)
Monocytes Absolute: 0.8 10*3/uL (ref 0.1–1.0)
Monocytes Relative: 7 %
Neutro Abs: 7.9 10*3/uL — ABNORMAL HIGH (ref 1.7–7.7)
Neutrophils Relative %: 73 %
Platelet Count: 596 10*3/uL — ABNORMAL HIGH (ref 150–400)
RBC: 3.31 MIL/uL — ABNORMAL LOW (ref 3.87–5.11)
RDW: 12.6 % (ref 11.5–15.5)
WBC Count: 10.7 10*3/uL — ABNORMAL HIGH (ref 4.0–10.5)
nRBC: 0 % (ref 0.0–0.2)

## 2023-08-30 LAB — CMP (CANCER CENTER ONLY)
ALT: 17 U/L (ref 0–44)
AST: 16 U/L (ref 15–41)
Albumin: 4.6 g/dL (ref 3.5–5.0)
Alkaline Phosphatase: 54 U/L (ref 38–126)
Anion gap: 5 (ref 5–15)
BUN: 15 mg/dL (ref 6–20)
CO2: 36 mmol/L — ABNORMAL HIGH (ref 22–32)
Calcium: 10.9 mg/dL — ABNORMAL HIGH (ref 8.9–10.3)
Chloride: 100 mmol/L (ref 98–111)
Creatinine: 0.84 mg/dL (ref 0.44–1.00)
GFR, Estimated: >60 mL/min (ref >60)
Glucose, Bld: 125 mg/dL — ABNORMAL HIGH (ref 70–99)
Potassium: 3.7 mmol/L (ref 3.5–5.1)
Sodium: 141 mmol/L (ref 135–145)
Total Bilirubin: 0.6 mg/dL (ref <1.2)
Total Protein: 7.8 g/dL (ref 6.5–8.1)

## 2023-08-30 LAB — RETICULOCYTES
Immature Retic Fract: 11.3 % (ref 2.3–15.9)
RBC.: 3.32 MIL/uL — ABNORMAL LOW (ref 3.87–5.11)
Retic Count, Absolute: 78.4 10*3/uL (ref 19.0–186.0)
Retic Ct Pct: 2.4 % (ref 0.4–3.1)

## 2023-08-30 LAB — FERRITIN: Ferritin: 163 ng/mL (ref 11–307)

## 2023-08-30 MED ORDER — INFLUENZA VIRUS VACC SPLIT PF (FLUZONE) 0.5 ML IM SUSY
0.5000 mL | PREFILLED_SYRINGE | Freq: Once | INTRAMUSCULAR | 0 refills | Status: AC
  Filled 2023-08-30: qty 0.5, 1d supply, fill #0

## 2023-08-30 MED ORDER — FOLIC ACID 1 MG PO TABS
1.0000 mg | ORAL_TABLET | Freq: Every day | ORAL | 3 refills | Status: AC
Start: 2023-08-30 — End: ?

## 2023-08-30 MED ORDER — HYDROXYUREA 500 MG PO CAPS
500.0000 mg | ORAL_CAPSULE | Freq: Two times a day (BID) | ORAL | 3 refills | Status: DC
Start: 2023-08-30 — End: 2024-05-21

## 2023-08-30 NOTE — Progress Notes (Signed)
Hematology and Oncology Follow Up Visit  Amy Johnson [id number] 06-20-1966 57 y.o. 08/30/2023   Principle Diagnosis:  Essential thrombocythemia - JAK2 positive Acquired Von Willebrand Crohn's Disease Iron deficiency anemia   Current Therapy:        Hydrea 500 mg PO daily - changed to 500 mg PO BID 10/10/2021 IV iron as indicated Folic acid - OTC   Interim History:  Amy Johnson is here today for follow-up.  She reports that she has not been compliant with her hydrea. She takes this mostly once per day due to forgetting her evening dose. She ran out of her folate but has plans to pick this up today. She denies side effects. She is reports being UTD on her mammogram and colon cancer screenings.   She has had no recent problems with the Crohn's disease.  She is on oral steroids for this.  When we last saw her, her ferritin was 104 with an iron saturation of 14%.  She has had no problems with bleeding.  She has had no nausea or vomiting.  She has had no cough or shortness of breath.  She has had no rashes.  There is been no leg swelling.  Overall, I would say that her performance status is probably ECOG 1. Wt Readings from Last 3 Encounters:  03/11/23 200 lb (90.7 kg)  12/30/22 207 lb (93.9 kg)  10/28/22 205 lb (93 kg)    Medications:  Allergies as of 08/30/2023       Reactions   Amlodipine Swelling   Irbesartan-hydrochlorothiazide Swelling   Valsartan-hydrochlorothiazide Other (See Comments)   Drowsy   Other Other (See Comments)   Olmesartan Medoxomil-hctz Cough        Medication List        Accurate as of August 30, 2023  3:17 PM. If you have any questions, ask your nurse or doctor.          acetaminophen 650 MG CR tablet Commonly known as: TYLENOL Take 650 mg by mouth every 8 (eight) hours.   budesonide 3 MG 24 hr capsule Commonly known as: ENTOCORT EC Take 9 mg by mouth daily.   Edarbyclor 40-25 MG Tabs Generic drug:  Azilsartan-Chlorthalidone Take 1 tablet by mouth daily.   FLINTSTONES W/IRON PO Take 1 tablet by mouth daily.   Fusion Plus Caps Take 1 capsule by mouth daily as directed.   hydroxyurea 500 MG capsule Commonly known as: HYDREA TAKE 1 CAPSULE(500 MG) BY MOUTH TWICE DAILY. MAY TAKE WITH FOOD TO MINIMIZE GI SIDE EFFECTS   metoprolol tartrate 50 MG tablet Commonly known as: LOPRESSOR Take 50 mg by mouth 2 (two) times daily.   Potassium Chloride ER 20 MEQ Tbcr Take 1 tablet by mouth daily.   spironolactone 25 MG tablet Commonly known as: ALDACTONE Take 12.5 mg by mouth daily.   VITAMIN D PO Take by mouth daily.        Allergies:  Allergies  Allergen Reactions   Amlodipine Swelling   Irbesartan-Hydrochlorothiazide Swelling   Valsartan-Hydrochlorothiazide Other (See Comments)    Drowsy    Other Other (See Comments)   Olmesartan Medoxomil-Hctz Cough    Past Medical History, Surgical history, Social history, and Family History were reviewed and updated.  Review of Systems: Review of Systems  HENT: Negative.    Eyes: Negative.   Respiratory: Negative.    Cardiovascular: Negative.   Gastrointestinal: Negative.   Genitourinary: Negative.   Musculoskeletal: Negative.   Skin: Negative.   Neurological: Negative.  Endo/Heme/Allergies: Negative.   Psychiatric/Behavioral: Negative.       Physical Exam:  vitals were not taken for this visit.   Wt Readings from Last 3 Encounters:  03/11/23 200 lb (90.7 kg)  12/30/22 207 lb (93.9 kg)  10/28/22 205 lb (93 kg)   Physical Exam Vitals reviewed.  HENT:     Head: Normocephalic and atraumatic.  Eyes:     Pupils: Pupils are equal, round, and reactive to light.  Cardiovascular:     Rate and Rhythm: Normal rate and regular rhythm.     Heart sounds: Normal heart sounds.  Pulmonary:     Effort: Pulmonary effort is normal.     Breath sounds: Normal breath sounds.  Abdominal:     General: Bowel sounds are normal.      Palpations: Abdomen is soft.  Musculoskeletal:        General: No tenderness or deformity. Normal range of motion.     Cervical back: Normal range of motion.  Lymphadenopathy:     Cervical: No cervical adenopathy.  Skin:    General: Skin is warm and dry.     Findings: No erythema or rash.  Neurological:     Mental Status: She is alert and oriented to person, place, and time.  Psychiatric:        Behavior: Behavior normal.        Thought Content: Thought content normal.        Judgment: Judgment normal.      Lab Results  Component Value Date   WBC 10.7 (H) 08/30/2023   HGB 12.5 08/30/2023   HCT 36.1 08/30/2023   MCV 109.1 (H) 08/30/2023   PLT 596 (H) 08/30/2023   Lab Results  Component Value Date   FERRITIN 104 03/11/2023   IRON 40 03/11/2023   TIBC 288 03/11/2023   UIBC 248 03/11/2023   IRONPCTSAT 14 03/11/2023   Lab Results  Component Value Date   RETICCTPCT 2.4 08/30/2023   RBC 3.32 (L) 08/30/2023   RBC 3.31 (L) 08/30/2023   No results found for: "KPAFRELGTCHN", "LAMBDASER", "KAPLAMBRATIO" No results found for: "IGGSERUM", "IGA", "IGMSERUM" No results found for: "TOTALPROTELP", "ALBUMINELP", "A1GS", "A2GS", "BETS", "BETA2SER", "GAMS", "MSPIKE", "SPEI"   Chemistry      Component Value Date/Time   NA 138 03/11/2023 1514   K 3.4 (L) 03/11/2023 1514   CL 100 03/11/2023 1514   CO2 31 03/11/2023 1514   BUN 16 03/11/2023 1514   CREATININE 0.81 03/11/2023 1514      Component Value Date/Time   CALCIUM 10.7 (H) 03/11/2023 1514   ALKPHOS 53 03/11/2023 1514   AST 16 03/11/2023 1514   ALT 19 03/11/2023 1514   BILITOT 0.4 03/11/2023 1514      Encounter Diagnoses  Name Primary?   Acquired von Willebrand disease (HCC)    Essential thrombocythemia (HCC)    Iron deficiency anemia due to chronic blood loss     Impression and Plan: Amy Johnson is a pleasant 57 yo African American with essential thrombocythemia.  Her thrombocythemia is JAK2 positive.  Today labs  look stable overall. We discussed various adjustments in her schedule that may help with compliance.   RTC 4-5 months MD, labs (CBC w/, CMP, iron, ferritin, retic, LDH, save smear)   Rushie Chestnut, PA-C 11/11/20243:17 PM

## 2023-08-31 ENCOUNTER — Telehealth: Payer: Self-pay | Admitting: Hematology & Oncology

## 2023-08-31 LAB — IRON AND IRON BINDING CAPACITY (CC-WL,HP ONLY)
Iron: 71 ug/dL (ref 28–170)
Saturation Ratios: 26 % (ref 10.4–31.8)
TIBC: 277 ug/dL (ref 250–450)
UIBC: 206 ug/dL (ref 148–442)

## 2023-08-31 NOTE — Telephone Encounter (Signed)
Unable to leave vm for pt regarding scheduling 4 month follow up with Dr. Myna Hidalgo. Will continue to try and reach pt regarding scheduling.

## 2023-10-10 ENCOUNTER — Other Ambulatory Visit: Payer: Self-pay | Admitting: Hematology & Oncology

## 2023-10-10 DIAGNOSIS — D500 Iron deficiency anemia secondary to blood loss (chronic): Secondary | ICD-10-CM

## 2023-10-11 ENCOUNTER — Encounter: Payer: Self-pay | Admitting: Family

## 2023-10-18 ENCOUNTER — Encounter: Payer: Self-pay | Admitting: Family

## 2023-10-21 ENCOUNTER — Other Ambulatory Visit: Payer: Self-pay | Admitting: Nurse Practitioner

## 2023-10-21 ENCOUNTER — Other Ambulatory Visit (HOSPITAL_COMMUNITY)
Admission: RE | Admit: 2023-10-21 | Discharge: 2023-10-21 | Disposition: A | Discharge status: Home / Self Care | Payer: 59 | Source: Ambulatory Visit | Attending: Nurse Practitioner | Admitting: Nurse Practitioner

## 2023-10-21 DIAGNOSIS — Z124 Encounter for screening for malignant neoplasm of cervix: Secondary | ICD-10-CM | POA: Insufficient documentation

## 2023-10-27 LAB — CYTOLOGY - PAP
Comment: NEGATIVE
Diagnosis: NEGATIVE
High risk HPV: NEGATIVE

## 2023-10-28 ENCOUNTER — Encounter: Payer: Self-pay | Admitting: Family

## 2023-11-17 ENCOUNTER — Ambulatory Visit
Admission: RE | Admit: 2023-11-17 | Discharge: 2023-11-17 | Disposition: A | Discharge status: Home / Self Care | Payer: 59 | Source: Ambulatory Visit | Attending: Family Medicine | Admitting: Family Medicine

## 2023-11-17 ENCOUNTER — Ambulatory Visit: Payer: 59

## 2023-11-17 DIAGNOSIS — Z1231 Encounter for screening mammogram for malignant neoplasm of breast: Secondary | ICD-10-CM

## 2024-02-24 ENCOUNTER — Other Ambulatory Visit: Payer: Self-pay | Admitting: Hematology & Oncology

## 2024-02-24 DIAGNOSIS — D500 Iron deficiency anemia secondary to blood loss (chronic): Secondary | ICD-10-CM

## 2024-04-19 ENCOUNTER — Telehealth: Payer: Self-pay

## 2024-04-19 NOTE — Telephone Encounter (Signed)
 Call from patient stating she has blood in her eye discussed with [facility] who recommends pt calls eye doctor for evaluation as they can determine the cause. Called patient back who states she does have an eye appt at [phone] today and will call back afterwards if needed.

## 2024-05-21 ENCOUNTER — Other Ambulatory Visit: Payer: Self-pay | Admitting: Hematology & Oncology

## 2024-05-21 DIAGNOSIS — D500 Iron deficiency anemia secondary to blood loss (chronic): Secondary | ICD-10-CM

## 2024-05-21 DIAGNOSIS — D473 Essential (hemorrhagic) thrombocythemia: Secondary | ICD-10-CM

## 2024-05-21 DIAGNOSIS — D6804 Acquired von Willebrand disease: Secondary | ICD-10-CM

## 2024-06-18 ENCOUNTER — Other Ambulatory Visit: Payer: Self-pay | Admitting: Hematology & Oncology

## 2024-06-18 DIAGNOSIS — D500 Iron deficiency anemia secondary to blood loss (chronic): Secondary | ICD-10-CM

## 2024-06-19 ENCOUNTER — Encounter: Payer: Self-pay | Admitting: Family

## 2024-08-18 ENCOUNTER — Other Ambulatory Visit (HOSPITAL_BASED_OUTPATIENT_CLINIC_OR_DEPARTMENT_OTHER): Payer: Self-pay

## 2024-08-18 MED ORDER — FLUZONE 0.5 ML IM SUSY
0.5000 mL | PREFILLED_SYRINGE | Freq: Once | INTRAMUSCULAR | 0 refills | Status: AC
  Filled 2024-08-18: qty 0.5, 1d supply, fill #0

## 2024-09-21 ENCOUNTER — Other Ambulatory Visit: Payer: Self-pay | Admitting: Hematology & Oncology

## 2024-09-21 DIAGNOSIS — D473 Essential (hemorrhagic) thrombocythemia: Secondary | ICD-10-CM

## 2024-09-21 DIAGNOSIS — D6804 Acquired von Willebrand disease: Secondary | ICD-10-CM

## 2024-09-21 DIAGNOSIS — D500 Iron deficiency anemia secondary to blood loss (chronic): Secondary | ICD-10-CM

## 2024-09-22 ENCOUNTER — Encounter: Payer: Self-pay | Admitting: Family

## 2024-10-22 ENCOUNTER — Other Ambulatory Visit: Payer: Self-pay | Admitting: Hematology & Oncology

## 2024-10-22 DIAGNOSIS — D500 Iron deficiency anemia secondary to blood loss (chronic): Secondary | ICD-10-CM

## 2024-10-23 ENCOUNTER — Encounter: Payer: Self-pay | Admitting: Family
